# Patient Record
Sex: Female | Born: 1985 | Race: White | Hispanic: No | Marital: Married | Smoking: Never smoker
Health system: Southern US, Community
[De-identification: ages and names within clinical notes are randomized; demographics above are authoritative.]

## PROBLEM LIST (undated history)

## (undated) DIAGNOSIS — K760 Fatty (change of) liver, not elsewhere classified: Secondary | ICD-10-CM

## (undated) DIAGNOSIS — K5792 Diverticulitis of intestine, part unspecified, without perforation or abscess without bleeding: Secondary | ICD-10-CM

## (undated) DIAGNOSIS — G4733 Obstructive sleep apnea (adult) (pediatric): Secondary | ICD-10-CM

## (undated) DIAGNOSIS — Z8619 Personal history of other infectious and parasitic diseases: Secondary | ICD-10-CM

## (undated) DIAGNOSIS — U071 COVID-19: Secondary | ICD-10-CM

## (undated) DIAGNOSIS — E785 Hyperlipidemia, unspecified: Secondary | ICD-10-CM

## (undated) DIAGNOSIS — R21 Rash and other nonspecific skin eruption: Secondary | ICD-10-CM

## (undated) DIAGNOSIS — K769 Liver disease, unspecified: Secondary | ICD-10-CM

## (undated) DIAGNOSIS — F419 Anxiety disorder, unspecified: Secondary | ICD-10-CM

## (undated) HISTORY — DX: Obstructive sleep apnea (adult) (pediatric): G47.33

## (undated) HISTORY — DX: Liver disease, unspecified: K76.9

## (undated) HISTORY — DX: Personal history of other infectious and parasitic diseases: Z86.19

## (undated) HISTORY — DX: Morbid (severe) obesity due to excess calories: E66.01

## (undated) HISTORY — DX: Hyperlipidemia, unspecified: E78.5

## (undated) HISTORY — PX: WISDOM TOOTH EXTRACTION: SHX21

## (undated) HISTORY — DX: Diverticulitis of intestine, part unspecified, without perforation or abscess without bleeding: K57.92

## (undated) HISTORY — DX: Fatty (change of) liver, not elsewhere classified: K76.0

---
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
## 2007-07-15 ENCOUNTER — Other Ambulatory Visit: Admission: RE | Admit: 2007-07-15 | Discharge: 2007-07-15 | Payer: Self-pay | Admitting: Family Medicine

## 2008-07-17 ENCOUNTER — Other Ambulatory Visit: Admission: RE | Admit: 2008-07-17 | Discharge: 2008-07-17 | Payer: Self-pay | Admitting: Family Medicine

## 2009-07-13 ENCOUNTER — Encounter: Payer: Self-pay | Admitting: Pulmonary Disease

## 2010-03-03 ENCOUNTER — Inpatient Hospital Stay (HOSPITAL_COMMUNITY): Admission: RE | Admit: 2010-03-03 | Discharge: 2010-03-05 | Payer: Self-pay | Admitting: Obstetrics and Gynecology

## 2011-02-15 LAB — CBC
Hemoglobin: 12.3 g/dL (ref 12.0–15.0)
MCHC: 34 g/dL (ref 30.0–36.0)
MCHC: 34.4 g/dL (ref 30.0–36.0)
MCV: 92.8 fL (ref 78.0–100.0)
Platelets: 140 10*3/uL — ABNORMAL LOW (ref 150–400)
Platelets: 166 10*3/uL (ref 150–400)
RDW: 13.9 % (ref 11.5–15.5)
WBC: 12.2 10*3/uL — ABNORMAL HIGH (ref 4.0–10.5)

## 2011-07-24 ENCOUNTER — Other Ambulatory Visit (HOSPITAL_COMMUNITY)
Admission: RE | Admit: 2011-07-24 | Discharge: 2011-07-24 | Disposition: A | Discharge status: Home / Self Care | Payer: Self-pay | Source: Ambulatory Visit | Attending: Family Medicine | Admitting: Family Medicine

## 2011-07-24 ENCOUNTER — Other Ambulatory Visit: Payer: Self-pay | Admitting: Family Medicine

## 2011-07-24 DIAGNOSIS — Z124 Encounter for screening for malignant neoplasm of cervix: Secondary | ICD-10-CM | POA: Insufficient documentation

## 2011-07-24 LAB — VITAMIN D 25 HYDROXY (VIT D DEFICIENCY, FRACTURES): Vit D, 25-Hydroxy: 22.9

## 2013-05-21 ENCOUNTER — Ambulatory Visit (INDEPENDENT_AMBULATORY_CARE_PROVIDER_SITE_OTHER): Payer: BC Managed Care – PPO | Admitting: Family Medicine

## 2013-05-21 ENCOUNTER — Ambulatory Visit (INDEPENDENT_AMBULATORY_CARE_PROVIDER_SITE_OTHER): Payer: BC Managed Care – PPO | Admitting: Pulmonary Disease

## 2013-05-21 ENCOUNTER — Encounter: Payer: Self-pay | Admitting: Family Medicine

## 2013-05-21 ENCOUNTER — Encounter: Payer: Self-pay | Admitting: Pulmonary Disease

## 2013-05-21 VITALS — BP 116/82 | HR 86 | Temp 98.0°F | Ht 62.0 in | Wt 265.0 lb

## 2013-05-21 VITALS — BP 110/78 | HR 85 | Temp 98.1°F | Ht 62.0 in | Wt 262.2 lb

## 2013-05-21 DIAGNOSIS — R0683 Snoring: Secondary | ICD-10-CM

## 2013-05-21 DIAGNOSIS — R0989 Other specified symptoms and signs involving the circulatory and respiratory systems: Secondary | ICD-10-CM

## 2013-05-21 DIAGNOSIS — R0609 Other forms of dyspnea: Secondary | ICD-10-CM

## 2013-05-21 DIAGNOSIS — E785 Hyperlipidemia, unspecified: Secondary | ICD-10-CM

## 2013-05-21 DIAGNOSIS — G4733 Obstructive sleep apnea (adult) (pediatric): Secondary | ICD-10-CM

## 2013-05-21 HISTORY — DX: Obstructive sleep apnea (adult) (pediatric): G47.33

## 2013-05-21 NOTE — Assessment & Plan Note (Signed)
She reports snoring, sleep disruption, witnessed apnea, and daytime sleepiness.  She has family history of sleep apnea and early coronary artery disease.  Her BMI is > 35.  I am concerned she could have sleep apnea.  We discussed how sleep apnea can affect various health problems including risks for hypertension, cardiovascular disease, and diabetes.  We also discussed how sleep disruption can increase risks for accident, such as while driving.  Weight loss as a means of improving sleep apnea was also reviewed.  Additional treatment options discussed were CPAP therapy, oral appliance, and surgical intervention.  To further assess will arrange for home sleep study.

## 2013-05-21 NOTE — Assessment & Plan Note (Addendum)
With nonrestorative sleep.  Reasonable to have OSA evaluation so will refer to pulm. ESS today = 14

## 2013-05-21 NOTE — Progress Notes (Signed)
Chief Complaint  Patient presents with  . Sleep Consult    referred by Dr. Sharen Hones for sleep apnea. Epworth Scale: 15.    History of Present Illness: Gabrielle Leach is a 27 y.o. female for evaluation of sleep problems.  She has gained about 100 lbs over the past 10 years.  With this weight change she has noticed more trouble with her sleep.  Her husband has been worried about her snoring, and has told her she stops breathing while asleep.  He now sleeps in a separate room.  She gets a very dry mouth, and has trouble sleeping on her back.  She falls asleep easily while sitting quiet.  She doesn't remember having dreams anymore.  She goes to sleep at 10 pm.  She falls asleep instantly.  She wakes up frequently times to use the bathroom, and is a restless sleeper.  She gets out of bed at 6 am.  She has to hit her snooze button several times before waking up.  She feels tire in the morning.  She occasionally gets morning headache.  She does not use anything to help her fall sleep.  She drinks coffee, energy drinks, and soda to help stay awake.  She occasionally talks in her sleep, and will sometimes get cramps in her legs  She denies sleep walking, sleep talking, bruxism, or nightmares.  There is no history of restless legs.  She denies sleep hallucinations, sleep paralysis, or cataplexy.  The Epworth score is 15 out of 24.   Gabrielle Leach  has a past medical history of Morbid obesity; HLD (hyperlipidemia); and History of chicken pox.  Gabrielle Leach  has no past surgical history on file.  Prior to Admission medications   Medication Sig Start Date End Date Taking? Authorizing Provider  loratadine (CLARITIN) 10 MG tablet Take 10 mg by mouth as needed for allergies.   Yes Historical Provider, MD    No Known Allergies  Her family history includes CAD (age of onset: 80) in her maternal grandmother; CAD (age of onset: 67) in her paternal grandmother; CAD (age of onset: 40) in her paternal  grandfather; Cancer in her others; Diabetes in her father, maternal grandmother, and maternal uncle; Heart disease in her mother; Hypertension in her paternal aunt; and Parkinson's disease in her paternal grandfather.  She  reports that she has never smoked. She has never used smokeless tobacco. She reports that  drinks alcohol. She reports that she does not use illicit drugs.  Review of Systems  Constitutional: Negative for fever, chills, diaphoresis, activity change, appetite change, fatigue and unexpected weight change.  HENT: Positive for congestion, sore throat and sneezing. Negative for hearing loss, ear pain, nosebleeds, facial swelling, rhinorrhea, mouth sores, trouble swallowing, neck pain, neck stiffness, dental problem, voice change, postnasal drip, sinus pressure, tinnitus and ear discharge.   Eyes: Negative for photophobia, discharge, itching and visual disturbance.  Respiratory: Positive for shortness of breath. Negative for apnea, cough, choking, chest tightness, wheezing and stridor.   Cardiovascular: Negative for chest pain, palpitations and leg swelling.  Gastrointestinal: Negative for nausea, vomiting, abdominal pain, constipation, blood in stool and abdominal distention.  Genitourinary: Negative for dysuria, urgency, frequency, hematuria, flank pain, decreased urine volume and difficulty urinating.  Musculoskeletal: Positive for joint swelling. Negative for myalgias, back pain, arthralgias and gait problem.  Skin: Negative for color change, pallor and rash.  Neurological: Positive for headaches. Negative for dizziness, tremors, seizures, syncope, speech difficulty, weakness, light-headedness and numbness.  Hematological: Negative for adenopathy. Does not bruise/bleed easily.  Psychiatric/Behavioral: Negative for confusion, sleep disturbance and agitation. The patient is not nervous/anxious.    Physical Exam:  General - No distress ENT - No sinus tenderness, MP 2, 2 + tonsils,  no oral exudate, no LAN, no thyromegaly, TM clear, pupils equal/reactive Cardiac - s1s2 regular, no murmur, pulses symmetric Chest - No wheeze/rales/dullness, good air entry, normal respiratory excursion Back - No focal tenderness Abd - Soft, non-tender, no organomegaly, + bowel sounds Ext - No edema Neuro - Normal strength, cranial nerves intact Skin - No rashes Psych - Normal mood, and behavior

## 2013-05-21 NOTE — Assessment & Plan Note (Signed)
Encouraged increased regular exercise. Discussed healthy lifestyle changes, suggested start regular brisk walking with family at park.

## 2013-05-21 NOTE — Patient Instructions (Signed)
Will arrange for home sleep study Will call to arrange for follow up after sleep study reviewed  

## 2013-05-21 NOTE — Assessment & Plan Note (Signed)
Check FLP when returns fasting. 

## 2013-05-21 NOTE — Patient Instructions (Signed)
Good to meet you today. Call us with questions. Return to see me at your convenience for physical, prior fasting for blood work. Pass by Marion's office today to schedule referral to lung doctor for sleep apnea evaluation.

## 2013-05-21 NOTE — Progress Notes (Deleted)
  Subjective:    Patient ID: Gabrielle Leach, female    DOB: Nov 17, 1986, 27 y.o.   MRN: [id number]  HPI    Review of Systems  Constitutional: Negative for fever, chills, diaphoresis, activity change, appetite change, fatigue and unexpected weight change.  HENT: Positive for congestion, sore throat and sneezing. Negative for hearing loss, ear pain, nosebleeds, facial swelling, rhinorrhea, mouth sores, trouble swallowing, neck pain, neck stiffness, dental problem, voice change, postnasal drip, sinus pressure, tinnitus and ear discharge.   Eyes: Negative for photophobia, discharge, itching and visual disturbance.  Respiratory: Positive for shortness of breath. Negative for apnea, cough, choking, chest tightness, wheezing and stridor.   Cardiovascular: Negative for chest pain, palpitations and leg swelling.  Gastrointestinal: Negative for nausea, vomiting, abdominal pain, constipation, blood in stool and abdominal distention.  Genitourinary: Negative for dysuria, urgency, frequency, hematuria, flank pain, decreased urine volume and difficulty urinating.  Musculoskeletal: Positive for joint swelling. Negative for myalgias, back pain, arthralgias and gait problem.  Skin: Negative for color change, pallor and rash.  Neurological: Positive for headaches. Negative for dizziness, tremors, seizures, syncope, speech difficulty, weakness, light-headedness and numbness.  Hematological: Negative for adenopathy. Does not bruise/bleed easily.  Psychiatric/Behavioral: Negative for confusion, sleep disturbance and agitation. The patient is not nervous/anxious.        Objective:   Physical Exam        Assessment & Plan:

## 2013-05-21 NOTE — Progress Notes (Signed)
Subjective:    Patient ID: Gabrielle Leach, female    DOB: Apr 26, 1986, 27 y.o.   MRN: [id number]  HPI CC: new pt to establish  Snoring - husband concerned because she snores significantly.  No apneic episodes.  No PNDyspnea.  Does awaken herself from loud snoring.  nonrestorative sleeping.  Falls asleep mid conversation.  No sleeping with driving. Easily falls asleep when reading book or watching TV. Does sleep 7-8 hours.  No trouble falling asleep.   Father with sleep apnea.  H/o borderline cholesterol levels.  Obesity - constant struggle. Body mass index is 47.95 kg/(m^2).  Has gained 100lbs in last 10 years.  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 Well woman exam - around 2012.  Normal paps in past.  Medications and allergies reviewed and updated in chart.  Past histories reviewed and updated if relevant as below. There are no active problems to display for this patient.  Past Medical History  Diagnosis Date  . Morbid obesity   . HLD (hyperlipidemia)     borderline  . History of chicken pox    History reviewed. No pertinent past surgical history. History  Substance Use Topics  . Smoking status: Never Smoker   . Smokeless tobacco: Never Used  . Alcohol Use: Yes     Comment: occ   Family History  Problem Relation Age of Onset  . CAD Paternal Grandmother 8  . CAD Paternal Grandfather 6  . Parkinson's disease Paternal Grandfather   . CAD Maternal Grandmother 45    thinks heart related  . Diabetes Maternal Grandmother   . Diabetes Father     borderline  . Diabetes Maternal Uncle   . Cancer Other     breast - great grandmother  . Cancer Other     liver - great grandfather  . Hypertension Paternal Aunt   . Heart disease Mother     "hardening of arteries"   No  Known Allergies No current outpatient prescriptions on file prior to visit.   No current facility-administered medications on file prior to visit.     Review of Systems  Constitutional: Negative for fever, chills, activity change, appetite change, fatigue and unexpected weight change.  HENT: Negative for hearing loss and neck pain.   Eyes: Positive for visual disturbance (needs to schedule eye appointment).  Respiratory: Negative for cough, chest tightness, shortness of breath and wheezing.   Cardiovascular: Negative for chest pain, palpitations and leg swelling.  Gastrointestinal: Negative for nausea, vomiting, abdominal pain, diarrhea, constipation, blood in stool and abdominal distention.  Genitourinary: Negative for hematuria and difficulty urinating.  Musculoskeletal: Negative for myalgias and arthralgias.  Skin: Negative for rash.  Neurological: Positive for headaches. Negative for dizziness, seizures and syncope.  Hematological: Negative for adenopathy. Does not bruise/bleed easily.  Psychiatric/Behavioral: Negative for dysphoric mood. The patient is not nervous/anxious.        Objective:   Physical Exam  Nursing note and vitals reviewed. Constitutional: She is oriented to person, place, and time. She appears well-developed and well-nourished. No distress.  HENT:  Head: Normocephalic and atraumatic.  Right Ear: External ear normal.  Left Ear: External ear normal.  Nose: Nose normal.  Mouth/Throat: Oropharynx is clear and moist. No oropharyngeal exudate.  mallampati 1  Eyes: Conjunctivae and EOM are normal. Pupils are equal,  round, and reactive to light. No scleral icterus.  Neck: Normal range of motion. Neck supple.  Cardiovascular: Normal rate, regular rhythm, normal heart sounds and intact distal pulses.   No murmur heard. Pulses:      Radial pulses are 2+ on the right side, and 2+ on the left side.  Pulmonary/Chest: Effort normal and breath sounds normal. No  respiratory distress. She has no wheezes. She has no rales.  Musculoskeletal: Normal range of motion. She exhibits no edema.  Lymphadenopathy:    She has no cervical adenopathy.  Neurological: She is alert and oriented to person, place, and time.  CN grossly intact, station and gait intact  Skin: Skin is warm and dry. No rash noted.  Psychiatric: She has a normal mood and affect. Her behavior is normal. Judgment and thought content normal.       Assessment & Plan:

## 2013-05-25 ENCOUNTER — Other Ambulatory Visit: Payer: Self-pay | Admitting: Family Medicine

## 2013-05-25 DIAGNOSIS — E785 Hyperlipidemia, unspecified: Secondary | ICD-10-CM

## 2013-05-25 DIAGNOSIS — D649 Anemia, unspecified: Secondary | ICD-10-CM

## 2013-05-26 ENCOUNTER — Other Ambulatory Visit (INDEPENDENT_AMBULATORY_CARE_PROVIDER_SITE_OTHER): Payer: BC Managed Care – PPO

## 2013-05-26 DIAGNOSIS — E785 Hyperlipidemia, unspecified: Secondary | ICD-10-CM

## 2013-05-26 DIAGNOSIS — D649 Anemia, unspecified: Secondary | ICD-10-CM

## 2013-05-26 LAB — LIPID PANEL
Cholesterol: 222 mg/dL — ABNORMAL HIGH (ref 0–200)
Triglycerides: 140 mg/dL (ref 0.0–149.0)
VLDL: 28 mg/dL (ref 0.0–40.0)

## 2013-05-26 LAB — CBC WITH DIFFERENTIAL/PLATELET
Basophils Relative: 0.5 % (ref 0.0–3.0)
Eosinophils Relative: 2 % (ref 0.0–5.0)
Hemoglobin: 13.4 g/dL (ref 12.0–15.0)
Lymphocytes Relative: 39.5 % (ref 12.0–46.0)
Monocytes Relative: 7.6 % (ref 3.0–12.0)
Neutrophils Relative %: 50.4 % (ref 43.0–77.0)
RBC: 4.24 Mil/uL (ref 3.87–5.11)
WBC: 7.3 10*3/uL (ref 4.5–10.5)

## 2013-05-26 LAB — COMPREHENSIVE METABOLIC PANEL
BUN: 15 mg/dL (ref 6–23)
CO2: 21 mEq/L (ref 19–32)
Calcium: 8.9 mg/dL (ref 8.4–10.5)
Chloride: 105 mEq/L (ref 96–112)
Creatinine, Ser: 0.8 mg/dL (ref 0.4–1.2)
GFR: 86.68 mL/min (ref >60.00)
Total Bilirubin: 0.5 mg/dL (ref 0.3–1.2)

## 2013-05-28 DIAGNOSIS — G479 Sleep disorder, unspecified: Secondary | ICD-10-CM

## 2013-05-28 DIAGNOSIS — R0989 Other specified symptoms and signs involving the circulatory and respiratory systems: Secondary | ICD-10-CM

## 2013-05-28 DIAGNOSIS — G4710 Hypersomnia, unspecified: Secondary | ICD-10-CM

## 2013-05-28 DIAGNOSIS — R0609 Other forms of dyspnea: Secondary | ICD-10-CM

## 2013-05-29 ENCOUNTER — Telehealth: Payer: Self-pay | Admitting: Pulmonary Disease

## 2013-05-29 DIAGNOSIS — G4730 Sleep apnea, unspecified: Secondary | ICD-10-CM

## 2013-05-29 NOTE — Telephone Encounter (Signed)
HST 05/28/13 >> AHI 7.9, SaO2 low 89%.  Will have my nurse schedule ROV to review results.  Okay to over book visit.

## 2013-06-02 ENCOUNTER — Other Ambulatory Visit (HOSPITAL_COMMUNITY)
Admission: RE | Admit: 2013-06-02 | Discharge: 2013-06-02 | Disposition: A | Discharge status: Home / Self Care | Payer: BC Managed Care – PPO | Source: Ambulatory Visit | Attending: Family Medicine | Admitting: Family Medicine

## 2013-06-02 ENCOUNTER — Ambulatory Visit (INDEPENDENT_AMBULATORY_CARE_PROVIDER_SITE_OTHER): Payer: BC Managed Care – PPO | Admitting: Family Medicine

## 2013-06-02 ENCOUNTER — Encounter: Payer: Self-pay | Admitting: Family Medicine

## 2013-06-02 VITALS — BP 110/70 | HR 84 | Temp 98.5°F | Ht 62.25 in | Wt 263.0 lb

## 2013-06-02 DIAGNOSIS — Z0001 Encounter for general adult medical examination with abnormal findings: Secondary | ICD-10-CM | POA: Insufficient documentation

## 2013-06-02 DIAGNOSIS — Z0000 Encounter for general adult medical examination without abnormal findings: Secondary | ICD-10-CM

## 2013-06-02 DIAGNOSIS — E785 Hyperlipidemia, unspecified: Secondary | ICD-10-CM

## 2013-06-02 DIAGNOSIS — R21 Rash and other nonspecific skin eruption: Secondary | ICD-10-CM | POA: Insufficient documentation

## 2013-06-02 DIAGNOSIS — Z01419 Encounter for gynecological examination (general) (routine) without abnormal findings: Secondary | ICD-10-CM | POA: Insufficient documentation

## 2013-06-02 LAB — HM PAP SMEAR

## 2013-06-02 MED ORDER — DOXYCYCLINE HYCLATE 100 MG PO CAPS: 100.0000 mg | ORAL_CAPSULE | Freq: Two times a day (BID) | ORAL | Status: DC

## 2013-06-02 MED ORDER — NORETHINDRONE ACET-ETHINYL EST 1-20 MG-MCG PO TABS: 1.0000 | ORAL_TABLET | Freq: Every day | ORAL | Status: DC
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
## 2013-06-02 NOTE — Assessment & Plan Note (Addendum)
Anticipate folliculitis - treat with doxy course. Alternatively consider early hydradenitis - and referral to derm.   Doubt PCOS.  Consider checking for this if doxy course does not improve sxs.

## 2013-06-02 NOTE — Patient Instructions (Addendum)
Low chol diet handout provided today. I've sent in birth control - start on Sunday after next period. Return as needed or in 1 year for next physical Try doxycyline course for possible skin infection under arms.  Watch for easy sunburns on this antibiotic.

## 2013-06-02 NOTE — Assessment & Plan Note (Signed)
Encouraged healthy diet changes.

## 2013-06-02 NOTE — Assessment & Plan Note (Signed)
TC and LDL elevated. Given her extensive fmhx CAD, recommended diet change.  Provided with low sodium diet handout.

## 2013-06-02 NOTE — Assessment & Plan Note (Addendum)
Preventative protocols reviewed and updated unless pt declined. Discussed healthy diet and lifestyle.  Discussed different birth control options - pt desires to continue OCP. Discussed importance of daily regimen. Discussed risks of hormonal contraceptives including but not limited to irregular bleed, DVT, and cardiovascular risk.

## 2013-06-02 NOTE — Telephone Encounter (Signed)
Pt has been scheduled for 06/05/2013 @ 2:00pm.

## 2013-06-04 ENCOUNTER — Encounter: Payer: Self-pay | Admitting: Family Medicine

## 2013-06-05 ENCOUNTER — Encounter: Payer: Self-pay | Admitting: Pulmonary Disease

## 2013-06-05 ENCOUNTER — Ambulatory Visit (INDEPENDENT_AMBULATORY_CARE_PROVIDER_SITE_OTHER): Payer: BC Managed Care – PPO | Admitting: Pulmonary Disease

## 2013-06-05 VITALS — BP 122/68 | HR 73 | Temp 98.4°F | Ht 62.0 in | Wt 267.2 lb

## 2013-06-05 DIAGNOSIS — G4733 Obstructive sleep apnea (adult) (pediatric): Secondary | ICD-10-CM

## 2013-06-05 NOTE — Progress Notes (Signed)
Chief Complaint  Patient presents with  . Follow-up    Pt here to discuss slepe study results    History of Present Illness: Gabrielle Leach is a 27 y.o. female with OSA.  She is here to review her home sleep study.   TESTS: HST 05/28/13 >> AHI 7.9, SaO2 low 89%.  Gabrielle Leach  has a past medical history of Morbid obesity; HLD (hyperlipidemia); History of chicken pox; and OSA (obstructive sleep apnea) (05/21/2013).  Gabrielle Leach  has no past surgical history on file.  Prior to Admission medications   Medication Sig Start Date End Date Taking? Authorizing Provider  acetaminophen (TYLENOL) 500 MG tablet Take 500 mg by mouth as needed for pain.   Yes Historical Provider, MD  doxycycline (VIBRAMYCIN) 100 MG capsule Take 1 capsule (100 mg total) by mouth 2 (two) times daily. 06/02/13  Yes Eustaquio Boyden, MD  loratadine (CLARITIN) 10 MG tablet Take 10 mg by mouth as needed for allergies.   Yes Historical Provider, MD  norethindrone-ethinyl estradiol (MICROGESTIN,JUNEL,LOESTRIN) 1-20 MG-MCG tablet Take 1 tablet by mouth daily. 06/02/13  Yes Eustaquio Boyden, MD    No Known Allergies   Physical Exam:  General - No distress ENT - No sinus tenderness, no oral exudate, no LAN, MP 2, 2 + tonsils Cardiac - s1s2 regular, no murmur Chest - No wheeze/rales/dullness Back - No focal tenderness Abd - Soft, non-tender Ext - No edema Neuro - Normal strength Skin - No rashes Psych - normal mood, and behavior   Assessment/Plan:  Gabrielle Helling, MD [facility] Pager:  [phone]

## 2013-06-05 NOTE — Patient Instructions (Signed)
Will arrange for CPAP set up  Follow up in 2 months after CPAP set up 

## 2013-06-05 NOTE — Assessment & Plan Note (Signed)
She has mild sleep apnea.  I have reviewed the recent sleep study results with the patient.  We discussed how sleep apnea can affect various health problems including risks for hypertension, cardiovascular disease, and diabetes.  We also discussed how sleep disruption can increase risks for accident, such as while driving.  Weight loss as a means of improving sleep apnea was also reviewed.  Additional treatment options discussed were CPAP therapy, oral appliance, and surgical intervention.  Will arrange for auto CPAP set up at home.    Encouraged her to continue with her diet and exercise plans.

## 2013-06-06 ENCOUNTER — Encounter: Payer: Self-pay | Admitting: Family Medicine

## 2013-06-06 ENCOUNTER — Encounter: Payer: Self-pay | Admitting: Pulmonary Disease

## 2013-06-20 ENCOUNTER — Encounter: Payer: Self-pay | Admitting: Family Medicine

## 2013-07-17 ENCOUNTER — Encounter: Payer: Self-pay | Admitting: Internal Medicine

## 2013-07-17 ENCOUNTER — Ambulatory Visit (INDEPENDENT_AMBULATORY_CARE_PROVIDER_SITE_OTHER): Payer: BC Managed Care – PPO | Admitting: Internal Medicine

## 2013-07-17 VITALS — BP 112/82 | HR 79 | Temp 97.7°F | Wt 263.0 lb

## 2013-07-17 DIAGNOSIS — S29012A Strain of muscle and tendon of back wall of thorax, initial encounter: Secondary | ICD-10-CM

## 2013-07-17 DIAGNOSIS — S239XXA Sprain of unspecified parts of thorax, initial encounter: Secondary | ICD-10-CM

## 2013-07-17 DIAGNOSIS — R1011 Right upper quadrant pain: Secondary | ICD-10-CM

## 2013-07-17 MED ORDER — CYCLOBENZAPRINE HCL 10 MG PO TABS: 10.0000 mg | ORAL_TABLET | Freq: Three times a day (TID) | ORAL | Status: DC | PRN

## 2013-07-17 NOTE — Patient Instructions (Signed)
Back Exercises These exercises may help you when beginning to rehabilitate your injury. Your symptoms may resolve with or without further involvement from your physician, physical therapist or athletic trainer. While completing these exercises, remember:   Restoring tissue flexibility helps normal motion to return to the joints. This allows healthier, less painful movement and activity.  An effective stretch should be held for at least 30 seconds.  A stretch should never be painful. You should only feel a gentle lengthening or release in the stretched tissue. STRETCH  Extension, Prone on Elbows   Lie on your stomach on the floor, a bed will be too soft. Place your palms about shoulder width apart and at the height of your head.  Place your elbows under your shoulders. If this is too painful, stack pillows under your chest.  Allow your body to relax so that your hips drop lower and make contact more completely with the floor.  Hold this position for __________ seconds.  Slowly return to lying flat on the floor. Repeat __________ times. Complete this exercise __________ times per day.  RANGE OF MOTION  Extension, Prone Press Ups   Lie on your stomach on the floor, a bed will be too soft. Place your palms about shoulder width apart and at the height of your head.  Keeping your back as relaxed as possible, slowly straighten your elbows while keeping your hips on the floor. You may adjust the placement of your hands to maximize your comfort. As you gain motion, your hands will come more underneath your shoulders.  Hold this position __________ seconds.  Slowly return to lying flat on the floor. Repeat __________ times. Complete this exercise __________ times per day.  RANGE OF MOTION- Quadruped, Neutral Spine   Assume a hands and knees position on a firm surface. Keep your hands under your shoulders and your knees under your hips. You may place padding under your knees for comfort.  Drop  your head and point your tail bone toward the ground below you. This will round out your low back like an angry cat. Hold this position for __________ seconds.  Slowly lift your head and release your tail bone so that your back sags into a large arch, like an old horse.  Hold this position for __________ seconds.  Repeat this until you feel limber in your low back.  Now, find your "sweet spot." This will be the most comfortable position somewhere between the two previous positions. This is your neutral spine. Once you have found this position, tense your stomach muscles to support your low back.  Hold this position for __________ seconds. Repeat __________ times. Complete this exercise __________ times per day.  STRETCH  Flexion, Single Knee to Chest   Lie on a firm bed or floor with both legs extended in front of you.  Keeping one leg in contact with the floor, bring your opposite knee to your chest. Hold your leg in place by either grabbing behind your thigh or at your knee.  Pull until you feel a gentle stretch in your low back. Hold __________ seconds.  Slowly release your grasp and repeat the exercise with the opposite side. Repeat __________ times. Complete this exercise __________ times per day.  STRETCH - Hamstrings, Standing  Stand or sit and extend your right / left leg, placing your foot on a chair or foot stool  Keeping a slight arch in your low back and your hips straight forward.  Lead with your chest and   lean forward at the waist until you feel a gentle stretch in the back of your right / left knee or thigh. (When done correctly, this exercise requires leaning only a small distance.)  Hold this position for __________ seconds. Repeat __________ times. Complete this stretch __________ times per day. STRENGTHENING  Deep Abdominals, Pelvic Tilt   Lie on a firm bed or floor. Keeping your legs in front of you, bend your knees so they are both pointed toward the ceiling and  your feet are flat on the floor.  Tense your lower abdominal muscles to press your low back into the floor. This motion will rotate your pelvis so that your tail bone is scooping upwards rather than pointing at your feet or into the floor.  With a gentle tension and even breathing, hold this position for __________ seconds. Repeat __________ times. Complete this exercise __________ times per day.  STRENGTHENING  Abdominals, Crunches   Lie on a firm bed or floor. Keeping your legs in front of you, bend your knees so they are both pointed toward the ceiling and your feet are flat on the floor. Cross your arms over your chest.  Slightly tip your chin down without bending your neck.  Tense your abdominals and slowly lift your trunk high enough to just clear your shoulder blades. Lifting higher can put excessive stress on the low back and does not further strengthen your abdominal muscles.  Control your return to the starting position. Repeat __________ times. Complete this exercise __________ times per day.  STRENGTHENING  Quadruped, Opposite UE/LE Lift   Assume a hands and knees position on a firm surface. Keep your hands under your shoulders and your knees under your hips. You may place padding under your knees for comfort.  Find your neutral spine and gently tense your abdominal muscles so that you can maintain this position. Your shoulders and hips should form a rectangle that is parallel with the floor and is not twisted.  Keeping your trunk steady, lift your right hand no higher than your shoulder and then your left leg no higher than your hip. Make sure you are not holding your breath. Hold this position __________ seconds.  Continuing to keep your abdominal muscles tense and your back steady, slowly return to your starting position. Repeat with the opposite arm and leg. Repeat __________ times. Complete this exercise __________ times per day. Document Released: 12/01/2005 Document  Revised: 02/05/2012 Document Reviewed: 02/25/2009 [employer] Patient Information 2014 [employer].  

## 2013-07-17 NOTE — Progress Notes (Signed)
Subjective:    Patient ID: Gabrielle Leach, female    DOB: [birth year], 27 y.o.   MRN: [id number]  HPI  Pt presents to the clinic today with c/o back pain. This started 3 months ago. The pain is under her right shoulder blade and usually occurs at night when she lays down. Yesterday, she did notice pain in her abdomen on the right side underneath her breast. She describes the pain as tight. She did have some associated nausea but no vomiting. She did have an episode of diarrhea. She has not had an injury to the area. The pain is not related to eating. The pain is different from reflux. She has taken goody powder which did not really help the pain. The pain is not worse with movement.  Review of Systems      Past Medical History  Diagnosis Date  . Morbid obesity   . HLD (hyperlipidemia)     borderline  . History of chicken pox   . OSA (obstructive sleep apnea) 05/21/2013    mild, set up with CPAP Craige Cotta)    Current Outpatient Prescriptions  Medication Sig Dispense Refill  . acetaminophen (TYLENOL) 500 MG tablet Take 500 mg by mouth as needed for pain.      Marland Kitchen doxycycline (VIBRAMYCIN) 100 MG capsule Take 1 capsule (100 mg total) by mouth 2 (two) times daily.  20 capsule  0  . loratadine (CLARITIN) 10 MG tablet Take 10 mg by mouth as needed for allergies.      Marland Kitchen norethindrone-ethinyl estradiol (MICROGESTIN,JUNEL,LOESTRIN) 1-20 MG-MCG tablet Take 1 tablet by mouth daily.  1 Package  11   No current facility-administered medications for this visit.    No Known Allergies  Family History  Problem Relation Age of Onset  . CAD Paternal Grandmother 68  . CAD Paternal Grandfather 79  . Parkinson's disease Paternal Grandfather   . CAD Maternal Grandmother 45    thinks heart related  . Diabetes Maternal Grandmother   . Diabetes Father     borderline  . Diabetes Maternal Uncle   . Cancer Other     breast - great grandmother  . Cancer Other     liver - great grandfather  . Hypertension  Paternal Aunt   . Heart disease Mother     "hardening of arteries"    History   Social History  . Marital Status: Married    Spouse Name: N/A    Number of Children: N/A  . Years of Education: N/A   Occupational History  . Not on file.   Social History Main Topics  . Smoking status: Never Smoker   . Smokeless tobacco: Never Used  . Alcohol Use: Yes     Comment: occ  . Drug Use: No  . Sexual Activity: Not on file   Other Topics Concern  . Not on file   Social History Narrative   Lives with husband and son, 1 cat   Occupation: Dance movement psychotherapist , 2nd grade teacher   Edu: BS   Activity: no regular exercise   Diet: good water, fruits/vegetables daily, diet soft drinks     Constitutional: Denies fever, malaise, fatigue, headache or abrupt weight changes.  Respiratory: Denies difficulty breathing, shortness of breath, cough or sputum production.   Cardiovascular: Denies chest pain, chest tightness, palpitations or swelling in the hands or feet.  Gastrointestinal: Pt reports pain in right upper abdomen. Denies bloating, constipation, diarrhea or blood in the stool.  Musculoskeletal: Pt reports  back pain. Denies decrease in range of motion, difficulty with gait, or joint pain and swelling.    No other specific complaints in a complete review of systems (except as listed in HPI above).  Objective:   Physical Exam   BP 112/82  Pulse 79  Temp(Src) 97.7 F (36.5 C) (Oral)  Wt 263 lb (119.296 kg)  BMI 48.09 kg/m2  SpO2 99% Wt Readings from Last 3 Encounters:  07/17/13 263 lb (119.296 kg)  06/05/13 267 lb 3.2 oz (121.201 kg)  06/02/13 263 lb (119.296 kg)    General: Appears her stated age, obese but well developed, well nourished in NAD. Cardiovascular: Normal rate and rhythm. S1,S2 noted.  No murmur, rubs or gallops noted. No JVD or BLE edema. No carotid bruits noted. Pulmonary/Chest: Normal effort and positive vesicular breath sounds. No respiratory distress. No  wheezes, rales or ronchi noted.  Abdomen: Soft and nontender. Normal bowel sounds, no bruits noted. No distention or masses noted. Liver, spleen and kidneys non palpable. Musculoskeletal: Normal range of motion. No signs of joint swelling. No difficulty with gait. Tender at the right trapezius.   BMET    Component Value Date/Time   NA 137 05/26/2013 0757   K 4.0 05/26/2013 0757   CL 105 05/26/2013 0757   CO2 21 05/26/2013 0757   GLUCOSE 98 05/26/2013 0757   BUN 15 05/26/2013 0757   CREATININE 0.8 05/26/2013 0757   CALCIUM 8.9 05/26/2013 0757    Lipid Panel     Component Value Date/Time   CHOL 222* 05/26/2013 0757   TRIG 140.0 05/26/2013 0757   HDL 54.20 05/26/2013 0757   CHOLHDL 4 05/26/2013 0757   VLDL 28.0 05/26/2013 0757    CBC    Component Value Date/Time   WBC 7.3 05/26/2013 0757   RBC 4.24 05/26/2013 0757   HGB 13.4 05/26/2013 0757   HCT 39.5 05/26/2013 0757   PLT 258.0 05/26/2013 0757   MCV 93.2 05/26/2013 0757   MCHC 33.9 05/26/2013 0757   RDW 12.9 05/26/2013 0757   LYMPHSABS 2.9 05/26/2013 0757   MONOABS 0.6 05/26/2013 0757   EOSABS 0.1 05/26/2013 0757   BASOSABS 0.0 05/26/2013 0757    Hgb A1C No results found for this basename: HGBA1C        Assessment & Plan:   Muscle strain of right upper back, new onset:  Advil as needed eRx for flexeril at night as needed Perform stretching exercises as indicated on handout Avoid heavy lifting for the next week  RUQ abdominal pain, new onset:  Resolved now If returns will obtain ultrasound of RUQ to assess for gallstones  Monitor symptoms, RTC if worse or persist

## 2013-07-23 ENCOUNTER — Encounter: Payer: Self-pay | Admitting: Family Medicine

## 2013-07-23 DIAGNOSIS — F411 Generalized anxiety disorder: Secondary | ICD-10-CM

## 2013-07-24 DIAGNOSIS — F419 Anxiety disorder, unspecified: Secondary | ICD-10-CM | POA: Insufficient documentation

## 2013-07-24 MED ORDER — FLUOXETINE HCL 20 MG PO TABS: 20.0000 mg | ORAL_TABLET | Freq: Every day | ORAL | Status: DC

## 2013-07-25 ENCOUNTER — Encounter: Payer: Self-pay | Admitting: Family Medicine

## 2013-07-25 ENCOUNTER — Telehealth: Payer: Self-pay | Admitting: Family Medicine

## 2013-07-25 ENCOUNTER — Ambulatory Visit
Admission: RE | Admit: 2013-07-25 | Discharge: 2013-07-25 | Disposition: A | Discharge status: Home / Self Care | Payer: BC Managed Care – PPO | Source: Ambulatory Visit | Attending: Family Medicine | Admitting: Family Medicine

## 2013-07-25 DIAGNOSIS — R1011 Right upper quadrant pain: Secondary | ICD-10-CM

## 2013-07-25 DIAGNOSIS — K769 Liver disease, unspecified: Secondary | ICD-10-CM | POA: Insufficient documentation

## 2013-07-25 DIAGNOSIS — K760 Fatty (change of) liver, not elsewhere classified: Secondary | ICD-10-CM

## 2013-07-25 NOTE — Telephone Encounter (Signed)
Spoke with patient -see imaging study. Gallbladder sludge, fatty liver, focal liver lesion. Will obtain MRI to better eval liver. Order placed in chart.

## 2013-07-25 NOTE — Telephone Encounter (Signed)
Ultrasound ordered. Please schedule office visit next week after ultrasound to discuss results.

## 2013-07-29 ENCOUNTER — Ambulatory Visit (INDEPENDENT_AMBULATORY_CARE_PROVIDER_SITE_OTHER): Payer: BC Managed Care – PPO | Admitting: Family Medicine

## 2013-07-29 ENCOUNTER — Telehealth: Payer: Self-pay | Admitting: Family Medicine

## 2013-07-29 ENCOUNTER — Encounter: Payer: Self-pay | Admitting: Family Medicine

## 2013-07-29 VITALS — BP 124/72 | HR 80 | Temp 98.1°F | Ht 62.0 in | Wt 260.5 lb

## 2013-07-29 DIAGNOSIS — K760 Fatty (change of) liver, not elsewhere classified: Secondary | ICD-10-CM

## 2013-07-29 DIAGNOSIS — K7689 Other specified diseases of liver: Secondary | ICD-10-CM

## 2013-07-29 DIAGNOSIS — F411 Generalized anxiety disorder: Secondary | ICD-10-CM

## 2013-07-29 DIAGNOSIS — K769 Liver disease, unspecified: Secondary | ICD-10-CM

## 2013-07-29 DIAGNOSIS — F418 Other specified anxiety disorders: Secondary | ICD-10-CM

## 2013-07-29 DIAGNOSIS — K8020 Calculus of gallbladder without cholecystitis without obstruction: Secondary | ICD-10-CM

## 2013-07-29 MED ORDER — ALPRAZOLAM 0.25 MG PO TABS: 0.2500 mg | ORAL_TABLET | Freq: Two times a day (BID) | ORAL | Status: DC | PRN

## 2013-07-29 NOTE — Assessment & Plan Note (Signed)
Actually anxiety directly related to gallbladder colic, and now to abnormal US findings. I have asked her to stop prozac, will use xanax prn anxiety, will monitor for improvement, if persistent anxiety, consider restarting prozac. Discussed side effects and concerns with benzo use including dependence/tolerance potential.

## 2013-07-29 NOTE — Assessment & Plan Note (Signed)
Discussed ddx of this finding.  Will await abd MRI.

## 2013-07-29 NOTE — Patient Instructions (Signed)
This sounds like gallbladder pain.   Pass by Marion's office to schedule abdominal MRI as well as surgery evaluation for gallbladder. Continue healthy diet changes as up to now. Good job! Avoid fatty greasy foods to help prevent flare. May use alprazolam as needed for anxiety.  Cholelithiasis Cholelithiasis (also called gallstones) is a form of gallbladder disease where gallstones form in your gallbladder. The gallbladder is a non-essential organ that stores bile made in the liver, which helps digest fats. Gallstones begin as small crystals and slowly grow into stones. Gallstone pain occurs when the gallbladder spasms, and a gallstone is blocking the duct. Pain can also occur when a stone passes out of the duct.  Women are more likely to develop gallstones than men. Other factors that increase the risk of gallbladder disease are:  Having multiple pregnancies. Physicians sometimes advise removing diseased gallbladders before future pregnancies.  Obesity.  Diets heavy in fried foods and fat.  Increasing age (older than 65).  Prolonged use of medications containing female hormones.  Diabetes mellitus.  Rapid weight loss.  Family history of gallstones (heredity). SYMPTOMS  Feeling sick to your stomach (nauseous).  Abdominal pain.  Yellowing of the skin (jaundice).  Sudden pain. It may persist from several minutes to several hours.  Worsening pain with deep breathing or when jarred.  Fever.  Tenderness to the touch. In some cases, when gallstones do not move into the bile duct, people have no pain or symptoms. These are called "silent" gallstones. TREATMENT In severe cases, emergency surgery may be required. HOME CARE INSTRUCTIONS   Only take over-the-counter or prescription medicines for pain, discomfort, or fever as directed by your caregiver.  Follow a low-fat diet until seen again. Fat causes the gallbladder to contract, which can result in pain.  Follow up as  instructed. Attacks are almost always recurrent and surgery is usually required for permanent treatment. SEEK IMMEDIATE MEDICAL CARE IF:   Your pain increases and is not controlled by medications.  You have an oral temperature above 102 F (38.9 C), not controlled by medication.  You develop nausea and vomiting. MAKE SURE YOU:   Understand these instructions.  Will watch your condition.  Will get help right away if you are not doing well or get worse. Document Released: 11/09/2005 Document Revised: 02/05/2012 Document Reviewed: 01/12/2011 [facility] Patient Information 2014 [place], [place].

## 2013-07-29 NOTE — Progress Notes (Signed)
  Subjective:    Patient ID: Gabrielle Leach, female    DOB: 1986/03/25, 27 y.o.   MRN: [id number]  HPI CC: f/u abnormal [place]   Seen by Nicki Reaper on 8/21, with 3 mo h/o RUQ pain described as tightness that radiated to R shoulder blade and was worse at night.  Thought muscle srain but advised if not better would consider abd [place].  Did not improve with muscle relaxant, so abd [place] was obtained.   This showed:  1. Gallbladder sludge with mildly prominent gallbladder wall thickness 2. Fatty liver 3. Hypoechoic area that measured 3.8 x 3.0 x 5.0 cm - rec further evaluation with dedicated imaging. Pending scheduling abd MRI.  Endorses intermittent RUQ abd pain worse over last several months, mainly at night.  Describes pain under right breast to shoulderblade associated with nausea and diarrhea.  Has had 2 episodes in last 2 weeks.  Episodes last 4-5 hours, progressively worsen in crescendo.  Prior to this episode had quesadilla with onions and peppers, next episode had bojangles.  Muscle relaxant didn't help.  Ibuprofen 400mg  didn't help.  pepto bismol didn't help.  No fevers/chills.  Year round at work - may need to take 1/2 day off for abd MRI.  Trying to implement healthy changes into life. Has stopped soft drinks. Went to gym this morning.  At prior visit with NP, she also endorsed anxiety attacks and we decided to start prozac at 20 mg daily.  Has been on this for last 4 days.  Actually interested in PRN med.  Wt Readings from Last 3 Encounters:  07/29/13 260 lb 8 oz (118.162 kg)  07/17/13 263 lb (119.296 kg)  06/05/13 267 lb 3.2 oz (121.201 kg)    Past Medical History  Diagnosis Date  . Morbid obesity   . HLD (hyperlipidemia)     borderline  . History of chicken pox   . OSA (obstructive sleep apnea) 05/21/2013    mild, set up with CPAP ([place])  . Fatty liver   . Liver lesion    No past surgical history on file.  Review of Systems Per HPI    Objective:   Physical Exam  Nursing  note and vitals reviewed. Constitutional: She appears well-developed and well-nourished. No distress.  HENT:  Head: Normocephalic and atraumatic.  Mouth/Throat: Oropharynx is clear and moist. No oropharyngeal exudate.  Eyes: Conjunctivae and EOM are normal. Pupils are equal, round, and reactive to light.  Cardiovascular: Normal rate, regular rhythm, normal heart sounds and intact distal pulses.   No murmur heard. Pulmonary/Chest: Effort normal and breath sounds normal. No respiratory distress. She has no wheezes. She has no rales.  Abdominal: Soft. Bowel sounds are normal. She exhibits no distension and no mass. There is no hepatosplenomegaly. There is no tenderness. There is no rigidity, no rebound, no guarding, no CVA tenderness and negative Murphy's sign.  obese  Musculoskeletal: She exhibits no edema.  Skin: Skin is warm and dry. No rash noted.  Psychiatric: She has a normal mood and affect.       Assessment & Plan:

## 2013-07-29 NOTE — Assessment & Plan Note (Signed)
Discussed, rec weight loss. Healthy diet and lifestyle changes already implemented.

## 2013-07-29 NOTE — Telephone Encounter (Signed)
Confidential Office Message [address] [place], [place] [address] p. [phone] f. [phone] To: [facility] (After Hours Triage) Fax: [phone] From: [facility] Date/ Time: 07/25/2013 7:56 PM Taken By: Jethro BolusDonnamae Jude Facility: not collected Patient: Gabrielle Leach DOB: [birth year] Phone: [phone] Reason for Call: Lab results Regarding Appointment: Appt Date: Appt Time: Unknown Provider: Reason: Details: Outcome: Confidential

## 2013-07-29 NOTE — Telephone Encounter (Signed)
[facility] Triage Call Report Triage Record Num: [id number] Operator: Dustin Flock [place] Patient Name: Gabrielle Leach Call Date & Time: 07/25/2013 7:48:44PM Patient Phone: [phone] PCP: Eustaquio Boyden Patient Gender: Female PCP Fax : [phone] Patient DOB: 25-Jul-1986 Practice Name: [facility] Reason for Call: Caller: Carrera/Patient; PCP: Eustaquio Boyden [facility]); CB#: [phone]; Call regarding Lab results; Contacted pt and she states Dr Sharen Hones has called and tale dwith her about her lab results. No further needs at this time. Protocol(s) Used: Office Note Recommended Outcome per Protocol: Information Noted and Sent to Office Reason for Outcome: Caller information to office Care Advice: ~ 07/25/2013 7:56:27PM Page 1 of 1 [email or web address]

## 2013-07-29 NOTE — Assessment & Plan Note (Signed)
Describes gallbladder colic, abd [place] with gallbladder sludge. Will refer to gen surgery for evaluation.

## 2013-08-07 ENCOUNTER — Ambulatory Visit
Admission: RE | Admit: 2013-08-07 | Discharge: 2013-08-07 | Disposition: A | Discharge status: Home / Self Care | Payer: BC Managed Care – PPO | Source: Ambulatory Visit | Attending: Family Medicine | Admitting: Family Medicine

## 2013-08-07 ENCOUNTER — Other Ambulatory Visit: Payer: Self-pay | Admitting: Radiology

## 2013-08-07 DIAGNOSIS — K769 Liver disease, unspecified: Secondary | ICD-10-CM

## 2013-08-07 MED ORDER — GADOXETATE DISODIUM 0.25 MMOL/ML IV SOLN
10.0000 mL | Freq: Once | INTRAVENOUS | Status: AC | PRN
  Administered 2013-08-07: 10 mL via INTRAVENOUS

## 2013-08-11 ENCOUNTER — Ambulatory Visit (INDEPENDENT_AMBULATORY_CARE_PROVIDER_SITE_OTHER): Payer: Self-pay | Admitting: General Surgery

## 2013-09-02 ENCOUNTER — Encounter (INDEPENDENT_AMBULATORY_CARE_PROVIDER_SITE_OTHER): Payer: Self-pay | Admitting: General Surgery

## 2013-09-02 ENCOUNTER — Ambulatory Visit (INDEPENDENT_AMBULATORY_CARE_PROVIDER_SITE_OTHER): Payer: BC Managed Care – PPO | Admitting: General Surgery

## 2013-09-02 VITALS — BP 130/80 | HR 72 | Temp 97.1°F | Resp 16 | Ht 62.0 in | Wt 260.4 lb

## 2013-09-02 DIAGNOSIS — K8020 Calculus of gallbladder without cholecystitis without obstruction: Secondary | ICD-10-CM

## 2013-09-02 NOTE — Patient Instructions (Addendum)
Your attacks of pain sound very typical for gallbladder attacks.  You will be scheduled for a hepatobiliary scan. Please call Dr. Jacinto Halim  office after the scan is done to discuss the results and to make a decision about whether we do gallbladder surgery or not.  Follow an extremely low-fat diet  Go to the drug store and by either Nexium or omeprazole to turned off the acid in your stomach. Start this medicines and  take one a day.         Laparoscopic Cholecystectomy Laparoscopic cholecystectomy is surgery to remove the gallbladder. The gallbladder is located slightly to the right of center in the abdomen, behind the liver. It is a concentrating and storage sac for the bile produced in the liver. Bile aids in the digestion and absorption of fats. Gallbladder disease (cholecystitis) is an inflammation of your gallbladder. This condition is usually caused by a buildup of gallstones (cholelithiasis) in your gallbladder. Gallstones can block the flow of bile, resulting in inflammation and pain. In severe cases, emergency surgery may be required. When emergency surgery is not required, you will have time to prepare for the procedure. Laparoscopic surgery is an alternative to open surgery. Laparoscopic surgery usually has a shorter recovery time. Your common bile duct may also need to be examined and explored. Your caregiver will discuss this with you if he or she feels this should be done. If stones are found in the common bile duct, they may be removed. LET YOUR CAREGIVER KNOW ABOUT:  Allergies to food or medicine.  Medicines taken, including vitamins, herbs, eyedrops, over-the-counter medicines, and creams.  Use of steroids (by mouth or creams).  Previous problems with anesthetics or numbing medicines.  History of bleeding problems or blood clots.  Previous surgery.  Other health problems, including diabetes and kidney problems.  Possibility of pregnancy, if this applies. RISKS AND  COMPLICATIONS All surgery is associated with risks. Some problems that may occur following this procedure include:  Infection.  Damage to the common bile duct, nerves, arteries, veins, or other internal organs such as the stomach or intestines.  Bleeding.  A stone may remain in the common bile duct. BEFORE THE PROCEDURE  Do not take aspirin for 3 days prior to surgery or blood thinners for 1 week prior to surgery.  Do not eat or drink anything after midnight the night before surgery.  Let your caregiver know if you develop a cold or other infectious problem prior to surgery.  You should be present 60 minutes before the procedure or as directed. PROCEDURE  You will be given medicine that makes you sleep (general anesthetic). When you are asleep, your surgeon will make several small cuts (incisions) in your abdomen. One of these incisions is used to insert a small, lighted scope (laparoscope) into the abdomen. The laparoscope helps the surgeon see into your abdomen. Carbon dioxide gas will be pumped into your abdomen. The gas allows more room for the surgeon to perform your surgery. Other operating instruments are inserted through the other incisions. Laparoscopic procedures may not be appropriate when:  There is major scarring from previous surgery.  The gallbladder is extremely inflamed.  There are bleeding disorders or unexpected cirrhosis of the liver.  A pregnancy is near term.  Other conditions make the laparoscopic procedure impossible. If your surgeon feels it is not safe to continue with a laparoscopic procedure, he or she will perform an open abdominal procedure. In this case, the surgeon will make an incision to  open the abdomen. This gives the surgeon a larger view and field to work within. This may allow the surgeon to perform procedures that sometimes cannot be performed with a laparoscope alone. Open surgery has a longer recovery time. AFTER THE PROCEDURE  You will be  taken to the recovery area where a nurse will watch and check your progress.  You may be allowed to go home the same day.  Do not resume physical activities until directed by your caregiver.  You may resume a normal diet and activities as directed. Document Released: 11/13/2005 Document Revised: 02/05/2012 Document Reviewed: 04/28/2011 [facility] Patient Information 2014 [place], [place].

## 2013-09-02 NOTE — Progress Notes (Addendum)
Patient ID: Gabrielle Leach, female   DOB: 06/03/1986, 27 y.o.   MRN: [id number]  Chief Complaint  Patient presents with  . New Evaluation    eval GB px    HPI Gabrielle Leach is a 27 y.o. female.  She is referred by Dr. Eustaquio Boyden at [facility] for evaluation and management decisions regarding suspected biliary colic.   The patient is married. 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 This to occur at my after supper, which is usually her larger meal. She tried muscle relaxants without relief. The pain attacks have become up slightly more progressive and now occur about once a week. Has not tried PPI's.  CBC and liver tests on June 30 were normal.  Gallbladder ultrasound shows sludge which is mobile but no discrete stones, borderline wall thickening 3.5-5 mm. Liver lesion was suggested. MRI shows focal fatty sparing versus focal nodular hyperplasia in the posterior left lobe. Followup MRI in 6-12 months was suggested. This was due to be a vague finding. I discussed all these x-rays with her. She is in no distress today.  Comorbidities include morbid obesity, hyperlipidemia, obstructive sleep apnea, fatty liver, anxiety depression on Prozac and Xanax. HPI  Past Medical History  Diagnosis Date  . Morbid obesity   . HLD (hyperlipidemia)     borderline  . History of chicken pox   . OSA (obstructive sleep apnea) 05/21/2013    mild, set up with CPAP ([place])  . Fatty liver   . Liver lesion     History reviewed. No pertinent past surgical history.  Family History  Problem Relation Age of Onset  . CAD Paternal Grandmother 58  . CAD Paternal Grandfather 10  . Parkinson's disease Paternal Grandfather   . CAD Maternal Grandmother 45    thinks heart related  . Diabetes Maternal Grandmother   . Diabetes Father      borderline  . Diabetes Maternal Uncle   . Cancer Other     breast - great grandmother  . Cancer Other     liver - great grandfather  . Hypertension Paternal Aunt   . Heart disease Mother     "hardening of arteries"    Social History History  Substance Use Topics  . Smoking status: Never Smoker   . Smokeless tobacco: Never Used  . Alcohol Use: Yes     Comment: occ    No Known Allergies  Current Outpatient Prescriptions  Medication Sig Dispense Refill  . acetaminophen (TYLENOL) 500 MG tablet Take 500 mg by mouth as needed for pain.      . cyclobenzaprine (FLEXERIL) 10 MG tablet Take 1 tablet (10 mg total) by mouth 3 (three) times daily as needed for muscle spasms.  30 tablet  0  . norethindrone-ethinyl estradiol (MICROGESTIN,JUNEL,LOESTRIN) 1-20 MG-MCG tablet Take 1 tablet by mouth daily.  1 Package  11  . loratadine (CLARITIN) 10 MG tablet Take 10 mg by mouth as needed for allergies.       No current facility-administered medications for this visit.    Review of Systems Review of Systems  Constitutional: Negative for fever, chills and unexpected weight change.  HENT: Negative for hearing loss, congestion, sore throat, trouble swallowing and voice change.   Eyes: Negative for visual disturbance.  Respiratory: Negative for cough and wheezing.   Cardiovascular: Negative for  chest pain, palpitations and leg swelling.  Gastrointestinal: Positive for nausea and abdominal pain. Negative for vomiting, diarrhea, constipation, blood in stool, abdominal distention and anal bleeding.  Genitourinary: Negative for hematuria, vaginal bleeding and difficulty urinating.  Musculoskeletal: Positive for back pain. Negative for arthralgias.  Skin: Negative for rash and wound.  Neurological: Negative for seizures, syncope and headaches.  Hematological: Negative for adenopathy. Does not bruise/bleed easily.  Psychiatric/Behavioral: Negative for confusion. The patient is nervous/anxious.      Blood pressure 130/80, pulse 72, temperature 97.1 F (36.2 C), temperature source Temporal, resp. rate 16, height 5\' 2"  (1.575 m), weight 260 lb 6.4 oz (118.117 kg).  Physical Exam Physical Exam  Constitutional: She is oriented to person, place, and time. She appears well-developed and well-nourished. No distress.  BMI 47.63  HENT:  Head: Normocephalic and atraumatic.  Nose: Nose normal.  Mouth/Throat: No oropharyngeal exudate.  Eyes: Conjunctivae and EOM are normal. Pupils are equal, round, and reactive to light. Left eye exhibits no discharge. No scleral icterus.  Neck: Neck supple. No JVD present. No tracheal deviation present. No thyromegaly present.  Cardiovascular: Normal rate, regular rhythm, normal heart sounds and intact distal pulses.   No murmur heard. Pulmonary/Chest: Effort normal and breath sounds normal. No respiratory distress. She has no wheezes. She has no rales. She exhibits no tenderness.  Abdominal: Soft. Bowel sounds are normal. She exhibits no distension and no mass. There is no tenderness. There is no rebound and no guarding.  Obese. No hernias. No tenderness. Liver not enlarged.  Musculoskeletal: She exhibits no edema and no tenderness.  Lymphadenopathy:    She has no cervical adenopathy.  Neurological: She is alert and oriented to person, place, and time. She exhibits normal muscle tone. Coordination normal.  Skin: Skin is warm. No rash noted. She is not diaphoretic. No erythema. No pallor.  Psychiatric: She has a normal mood and affect. Her behavior is normal. Judgment and thought content normal.    Data Reviewed Office notes from a lower [facility], lab work from June, ultrasound, MRI  Assessment    History and gallbladder ultrasound are consistent with biliary colic. Gallstones not diagnosed. I am suspicious that she is having biliary colic.  Morbid obesity  Anxiety and depression  Sleep apnea  Fatty liver and possible FNH     Plan    I  told her that I was suspicious that she was having gallbladder attacks and that ultimately she would require a cholecystectomy.  Were going to get a CCK stimulated hepatobiliary scan first, and to avoid another office visit co-pay, we will discuss this on the phone and try to make a decision about gallbladder surgery by phone  I discussed the indications, details, techniques, and numerous risks of gallbladder surgery with her. She was given a patient information booklet. She is aware of the risk of bleeding, infection, conversion to open laparotomy, bile leak, injury to adjacent organs, hernias, and other unforeseen problems. She had has all these issues all her questions were answered. She agrees with the plan.  In the meantime she is going to start  omeprazole or Nexium daily as a PPI  trial.   Addendum(09/15/2013): Hepatobiliary scan shows nonvisualization of the gallbladder, strongly suggesting that her symptoms are due to biliary tract origin. She was contacted by our nursing staff today and offered an office visit or proceeding with gallbladder surgery. She stated that she would like to proceed with gallbladder surgery and that will be scheduled at her  convenience.       Angelia Mould. Derrell Lolling, M.D., [facility] General and Minimally invasive Surgery Breast and Colorectal Surgery Office:   [phone] Pager:   [phone]  09/02/2013, 3:23 PM

## 2013-09-15 ENCOUNTER — Telehealth (INDEPENDENT_AMBULATORY_CARE_PROVIDER_SITE_OTHER): Payer: Self-pay

## 2013-09-15 ENCOUNTER — Encounter (HOSPITAL_COMMUNITY)
Admission: RE | Admit: 2013-09-15 | Discharge: 2013-09-15 | Disposition: A | Discharge status: Home / Self Care | Payer: BC Managed Care – PPO | Source: Ambulatory Visit | Attending: General Surgery | Admitting: General Surgery

## 2013-09-15 ENCOUNTER — Other Ambulatory Visit (INDEPENDENT_AMBULATORY_CARE_PROVIDER_SITE_OTHER): Payer: Self-pay | Admitting: General Surgery

## 2013-09-15 DIAGNOSIS — R1011 Right upper quadrant pain: Secondary | ICD-10-CM | POA: Insufficient documentation

## 2013-09-15 DIAGNOSIS — K8020 Calculus of gallbladder without cholecystitis without obstruction: Secondary | ICD-10-CM

## 2013-09-15 MED ORDER — MORPHINE SULFATE 4 MG/ML IJ SOLN
INTRAMUSCULAR | Status: AC
  Administered 2013-09-15: 4 mg via INTRAVENOUS
  Filled 2013-09-15: qty 1

## 2013-09-15 MED ORDER — TECHNETIUM TC 99M MEBROFENIN IV KIT
5.0000 | PACK | Freq: Once | INTRAVENOUS | Status: AC | PRN
  Administered 2013-09-15: 5 via INTRAVENOUS

## 2013-09-15 MED ORDER — MORPHINE SULFATE 4 MG/ML IJ SOLN
4.0000 mg | Freq: Once | INTRAMUSCULAR | Status: AC
  Administered 2013-09-15: 4 mg via INTRAVENOUS

## 2013-09-15 MED ORDER — MORPHINE SULFATE 25 MG/ML IV SOLN: 4.0000 mg/h | INTRAVENOUS | Status: DC

## 2013-09-15 NOTE — Telephone Encounter (Signed)
Message copied by Ivory Broad on Mon Sep 15, 2013  3:17 PM ------      Message from: Ernestene Mention      Created: Mon Sep 15, 2013  2:35 PM       [place] Dec,      Call Ms. Thew and tell her that her HIDA scan is markedly abnormal. So the gallbladder did not visualize at all. Tell her that this essentially confirms that her symptoms are due to her gallbladder. Give her the option of going ahead and scheduling surgery. If she would like, I would be happy to call her and discuss this with her before we decide anything... whatever her preference.                  hmi      ----- Message -----         From: Rad Results In Interface         Sent: 09/15/2013   1:27 PM           To: Ernestene Mention, MD                   ------

## 2013-09-15 NOTE — Telephone Encounter (Signed)
I called and spoke to the pt.  I gave her the results.  She wants to go ahead and schedule the surgery.  She would like it to be the day after Christmas if possible.  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  I will get the orders together and send it to the schedulers.

## 2013-09-15 NOTE — Telephone Encounter (Signed)
Thanks  .Marland Kitchen Let me know if I need to enter orders into EPIC, or whether she wants this done at [facility].  hmi

## 2013-09-16 ENCOUNTER — Telehealth (INDEPENDENT_AMBULATORY_CARE_PROVIDER_SITE_OTHER): Payer: Self-pay | Admitting: General Surgery

## 2013-09-16 ENCOUNTER — Other Ambulatory Visit (INDEPENDENT_AMBULATORY_CARE_PROVIDER_SITE_OTHER): Payer: Self-pay | Admitting: General Surgery

## 2013-09-16 NOTE — Telephone Encounter (Signed)
I called and spoke to the pt.  She has no preference as to where the surgery is done.  Just what is available is fine.

## 2013-09-16 NOTE — Telephone Encounter (Signed)
Orders for lap chole entered into Epic.  hmi

## 2013-09-16 NOTE — Telephone Encounter (Signed)
Pt called sx scheduling requesting sx for 11/20 or 11/21 / no orders for surgery

## 2013-10-02 ENCOUNTER — Other Ambulatory Visit: Payer: Self-pay

## 2013-10-15 ENCOUNTER — Encounter (HOSPITAL_COMMUNITY): Payer: Self-pay | Admitting: Pharmacy Technician

## 2013-10-17 ENCOUNTER — Inpatient Hospital Stay (HOSPITAL_COMMUNITY): Admission: RE | Admit: 2013-10-17 | Payer: BC Managed Care – PPO | Source: Ambulatory Visit

## 2013-10-17 NOTE — Patient Instructions (Addendum)
Gabrielle Leach  10/17/2013                           YOUR PROCEDURE IS SCHEDULED ON: 10/21/13               PLEASE REPORT TO SHORT STAY CENTER AT :  7:00 am               CALL THIS NUMBER IF ANY PROBLEMS THE DAY OF SURGERY :               [phone]                      REMEMBER:   Do not eat food or drink liquids AFTER MIDNIGHT   Take these medicines the morning of surgery with A SIP OF WATER: none   Do not wear jewelry, make-up   Do not wear lotions, powders, or perfumes.   Do not shave legs or underarms 12 hrs. before surgery (men may shave face)  Do not bring valuables to the hospital.  Contacts, dentures or bridgework may not be worn into surgery.  Leave suitcase in the car. After surgery it may be brought to your room.  For patients admitted to the hospital more than one night, checkout time is 11:00                          The day of discharge.   Patients discharged the day of surgery will not be allowed to drive home                             If going home same day of surgery, must have someone stay with you first                           24 hrs at home and arrange for some one to drive you home from hospital.    Special Instructions:   Please read over the following fact sheets that you were given:                                  1. [name] PREPARING FOR SURGERY SHEET                                                X_____________________________________________________________________        Failure to follow these instructions may result in cancellation of your surgery

## 2013-10-17 NOTE — H&P (Signed)
Gabrielle Leach    MRN:  [id number]   Description: 27 year old female  Provider: Ernestene Mention, MD  Department: [facility]        Diagnoses    Gallbladder colic    -  Primary    [phone]      Reason for Visit    New Evaluation    eval GB px        Current Vitals - Last Recorded    BP Pulse Temp(Src) Resp Ht Wt    130/80 72 97.1 F (36.2 C) (Temporal) 16 5\' 2"  (1.575 m) 260 lb 6.4 oz (118.117 kg)    BMI 47.62 kg/m2                 History and Physical   Ernestene Mention, MD    Status: Addendum            Patient ID: Gabrielle Leach, female   DOB: 1986/04/13, 27 y.o.   MRN: [id number]               HPI Gabrielle Leach is a 27 y.o. female.  She is referred by Dr. Eustaquio Boyden at [facility] for evaluation and management decisions regarding suspected biliary colic.    The patient is married. 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 This to occur at my after supper, which is usually her larger meal. She tried muscle relaxants without relief. The pain attacks have become up slightly more progressive and now occur about once a week. Has not tried PPI's.   CBC and liver tests on June 30 were normal.   Gallbladder ultrasound shows sludge which is mobile but no discrete stones, borderline wall thickening 3.5-5 mm. Liver lesion was suggested. MRI shows focal fatty sparing versus focal nodular hyperplasia in the posterior left lobe. Followup MRI in 6-12 months was suggested. This was due to be a vague finding. I discussed all these x-rays with her. She is in no distress today.   Comorbidities include morbid obesity, hyperlipidemia, obstructive sleep apnea, fatty liver, anxiety depression on Prozac and Xanax.       Past Medical History   Diagnosis  Date   .  Morbid obesity     .  HLD  (hyperlipidemia)         borderline   .  History of chicken pox     .  OSA (obstructive sleep apnea)  05/21/2013       mild, set up with CPAP ([place])   .  Fatty liver     .  Liver lesion          History reviewed. No pertinent past surgical history.    Family History   Problem  Relation  Age of Onset   .  CAD  Paternal Grandmother  43   .  CAD  Paternal Grandfather  31   .  Parkinson's disease  Paternal Grandfather     .  CAD  Maternal Grandmother  45       thinks heart related   .  Diabetes  Maternal Grandmother     .  Diabetes  Father         borderline   .  Diabetes  Maternal Uncle     .  Cancer  Other  breast - great grandmother   .  Cancer  Other         liver - great grandfather   .  Hypertension  Paternal Aunt     .  Heart disease  Mother         "hardening of arteries"        Social History History   Substance Use Topics   .  Smoking status:  Never Smoker    .  Smokeless tobacco:  Never Used   .  Alcohol Use:  Yes         Comment: occ        No Known Allergies    Current Outpatient Prescriptions   Medication  Sig  Dispense  Refill   .  acetaminophen (TYLENOL) 500 MG tablet  Take 500 mg by mouth as needed for pain.         .  cyclobenzaprine (FLEXERIL) 10 MG tablet  Take 1 tablet (10 mg total) by mouth 3 (three) times daily as needed for muscle spasms.   30 tablet   0   .  norethindrone-ethinyl estradiol (MICROGESTIN,JUNEL,LOESTRIN) 1-20 MG-MCG tablet  Take 1 tablet by mouth daily.   1 Package   11   .  loratadine (CLARITIN) 10 MG tablet  Take 10 mg by mouth as needed for allergies.                Review of Systems   Constitutional: Negative for fever, chills and unexpected weight change.  HENT: Negative for hearing loss, congestion, sore throat, trouble swallowing and voice change.   Eyes: Negative for visual disturbance.  Respiratory: Negative for cough and wheezing.   Cardiovascular: Negative for chest pain, palpitations and leg  swelling.  Gastrointestinal: Positive for nausea and abdominal pain. Negative for vomiting, diarrhea, constipation, blood in stool, abdominal distention and anal bleeding.  Genitourinary: Negative for hematuria, vaginal bleeding and difficulty urinating.  Musculoskeletal: Positive for back pain. Negative for arthralgias.  Skin: Negative for rash and wound.  Neurological: Negative for seizures, syncope and headaches.  Hematological: Negative for adenopathy. Does not bruise/bleed easily.  Psychiatric/Behavioral: Negative for confusion. The patient is nervous/anxious.       Blood pressure 130/80, pulse 72, temperature 97.1 F (36.2 C), temperature source Temporal, resp. rate 16, height 5\' 2"  (1.575 m), weight 260 lb 6.4 oz (118.117 kg).   Physical Exam  Constitutional: She is oriented to person, place, and time. She appears well-developed and well-nourished. No distress.  BMI 47.63  HENT:   Head: Normocephalic and atraumatic.   Nose: Nose normal.   Mouth/Throat: No oropharyngeal exudate.  Eyes: Conjunctivae and EOM are normal. Pupils are equal, round, and reactive to light. Left eye exhibits no discharge. No scleral icterus.  Neck: Neck supple. No JVD present. No tracheal deviation present. No thyromegaly present.  Cardiovascular: Normal rate, regular rhythm, normal heart sounds and intact distal pulses.    No murmur heard. Pulmonary/Chest: Effort normal and breath sounds normal. No respiratory distress. She has no wheezes. She has no rales. She exhibits no tenderness.  Abdominal: Soft. Bowel sounds are normal. She exhibits no distension and no mass. There is no tenderness. There is no rebound and no guarding.  Obese. No hernias. No tenderness. Liver not enlarged.  Musculoskeletal: She exhibits no edema and no tenderness.  Lymphadenopathy:    She has no cervical adenopathy.  Neurological: She is alert and oriented to person, place, and time. She exhibits normal muscle tone.  Coordination  normal.  Skin: Skin is warm. No rash noted. She is not diaphoretic. No erythema. No pallor.  Psychiatric: She has a normal mood and affect. Her behavior is normal. Judgment and thought content normal.      Data Reviewed Office notes from a lower [facility], lab work from June, ultrasound, MRI   Assessment    History and gallbladder ultrasound are consistent with biliary colic. Gallstones not diagnosed. I am suspicious that she is having biliary colic.   Morbid obesity   Anxiety and depression   Sleep apnea   Fatty liver and possible FNH      Plan    I told her that I was suspicious that she was having gallbladder attacks and that ultimately she would require a cholecystectomy.   Were going to get a CCK stimulated hepatobiliary scan first, and to avoid another office visit co-pay, we will discuss this on the phone and try to make a decision about gallbladder surgery by phone   I discussed the indications, details, techniques, and numerous risks of gallbladder surgery with her. She was given a patient information booklet. She is aware of the risk of bleeding, infection, conversion to open laparotomy, bile leak, injury to adjacent organs, hernias, and other unforeseen problems. She had has all these issues all her questions were answered. She agrees with the plan.   In the meantime she is going to start  omeprazole or Nexium daily as a PPI  trial.    Addendum(09/15/2013): Hepatobiliary scan shows nonvisualization of the gallbladder, strongly suggesting that her symptoms are due to biliary tract origin. She was contacted by our nursing staff today and offered an office visit or proceeding with gallbladder surgery. She stated that she would like to proceed with gallbladder surgery and that will be scheduled at her convenience.          Angelia Mould. Derrell Lolling, M.D., [facility] General and Minimally invasive Surgery Breast and  Colorectal Surgery Office:   [phone] Pager:   [phone]

## 2013-10-20 ENCOUNTER — Encounter (HOSPITAL_COMMUNITY): Payer: Self-pay

## 2013-10-20 ENCOUNTER — Encounter (HOSPITAL_COMMUNITY)
Admission: RE | Admit: 2013-10-20 | Discharge: 2013-10-20 | Disposition: A | Discharge status: Home / Self Care | Payer: BC Managed Care – PPO | Source: Ambulatory Visit | Attending: General Surgery | Admitting: General Surgery

## 2013-10-20 HISTORY — DX: Anxiety disorder, unspecified: F41.9

## 2013-10-20 HISTORY — DX: Rash and other nonspecific skin eruption: R21

## 2013-10-20 LAB — COMPREHENSIVE METABOLIC PANEL
AST: 27 U/L (ref 0–37)
Albumin: 3.6 g/dL (ref 3.5–5.2)
Alkaline Phosphatase: 59 U/L (ref 39–117)
BUN: 10 mg/dL (ref 6–23)
CO2: 24 mEq/L (ref 19–32)
Calcium: 9.4 mg/dL (ref 8.4–10.5)
Creatinine, Ser: 0.76 mg/dL (ref 0.50–1.10)
GFR calc Af Amer: >90 mL/min (ref >90)
GFR calc non Af Amer: >90 mL/min (ref >90)
Glucose, Bld: 92 mg/dL (ref 70–99)
Total Protein: 7 g/dL (ref 6.0–8.3)

## 2013-10-20 LAB — CBC WITH DIFFERENTIAL/PLATELET
Basophils Absolute: 0 10*3/uL (ref 0.0–0.1)
Eosinophils Absolute: 0.1 10*3/uL (ref 0.0–0.7)
Eosinophils Relative: 1 % (ref 0–5)
HCT: 40.6 % (ref 36.0–46.0)
Hemoglobin: 13.8 g/dL (ref 12.0–15.0)
Lymphocytes Relative: 29 % (ref 12–46)
Lymphs Abs: 2.3 10*3/uL (ref 0.7–4.0)
MCH: 30.3 pg (ref 26.0–34.0)
MCHC: 34 g/dL (ref 30.0–36.0)
MCV: 89 fL (ref 78.0–100.0)
Monocytes Absolute: 0.5 10*3/uL (ref 0.1–1.0)
Platelets: 271 10*3/uL (ref 150–400)
RBC: 4.56 MIL/uL (ref 3.87–5.11)
RDW: 12.3 % (ref 11.5–15.5)
WBC: 7.9 10*3/uL (ref 4.0–10.5)

## 2013-10-20 LAB — HCG, SERUM, QUALITATIVE: Preg, Serum: NEGATIVE

## 2013-10-21 ENCOUNTER — Ambulatory Visit (HOSPITAL_COMMUNITY): Payer: BC Managed Care – PPO | Admitting: Anesthesiology

## 2013-10-21 ENCOUNTER — Encounter (HOSPITAL_COMMUNITY): Payer: Self-pay | Admitting: *Deleted

## 2013-10-21 ENCOUNTER — Encounter (HOSPITAL_COMMUNITY): Payer: BC Managed Care – PPO | Admitting: Anesthesiology

## 2013-10-21 ENCOUNTER — Ambulatory Visit (HOSPITAL_COMMUNITY): Payer: BC Managed Care – PPO

## 2013-10-21 ENCOUNTER — Ambulatory Visit (HOSPITAL_COMMUNITY)
Admission: RE | Admit: 2013-10-21 | Discharge: 2013-10-21 | Disposition: A | Discharge status: Home / Self Care | Payer: BC Managed Care – PPO | Source: Ambulatory Visit | Attending: General Surgery | Admitting: General Surgery

## 2013-10-21 ENCOUNTER — Encounter (HOSPITAL_COMMUNITY)
Admission: RE | Disposition: A | Discharge status: Home / Self Care | Payer: Self-pay | Source: Ambulatory Visit | Attending: General Surgery

## 2013-10-21 DIAGNOSIS — E785 Hyperlipidemia, unspecified: Secondary | ICD-10-CM | POA: Insufficient documentation

## 2013-10-21 DIAGNOSIS — Z79899 Other long term (current) drug therapy: Secondary | ICD-10-CM | POA: Insufficient documentation

## 2013-10-21 DIAGNOSIS — K7689 Other specified diseases of liver: Secondary | ICD-10-CM | POA: Insufficient documentation

## 2013-10-21 DIAGNOSIS — K8010 Calculus of gallbladder with chronic cholecystitis without obstruction: Secondary | ICD-10-CM | POA: Insufficient documentation

## 2013-10-21 DIAGNOSIS — Z6841 Body Mass Index (BMI) 40.0 and over, adult: Secondary | ICD-10-CM | POA: Insufficient documentation

## 2013-10-21 DIAGNOSIS — G4733 Obstructive sleep apnea (adult) (pediatric): Secondary | ICD-10-CM | POA: Insufficient documentation

## 2013-10-21 HISTORY — PX: CHOLECYSTECTOMY: SHX55

## 2013-10-21 SURGERY — LAPAROSCOPIC CHOLECYSTECTOMY WITH INTRAOPERATIVE CHOLANGIOGRAM
Anesthesia: General | Site: Abdomen | Wound class: Clean Contaminated

## 2013-10-21 MED ORDER — ACETAMINOPHEN 10 MG/ML IV SOLN
1000.0000 mg | Freq: Once | INTRAVENOUS | Status: AC
  Administered 2013-10-21: 1000 mg via INTRAVENOUS
  Filled 2013-10-21: qty 100

## 2013-10-21 MED ORDER — SUCCINYLCHOLINE CHLORIDE 20 MG/ML IJ SOLN
INTRAMUSCULAR | Status: DC | PRN
  Administered 2013-10-21: 100 mg via INTRAVENOUS

## 2013-10-21 MED ORDER — NEOSTIGMINE METHYLSULFATE 1 MG/ML IJ SOLN
INTRAMUSCULAR | Status: DC | PRN
  Administered 2013-10-21: 4 mg via INTRAVENOUS

## 2013-10-21 MED ORDER — ONDANSETRON HCL 4 MG/2ML IJ SOLN
INTRAMUSCULAR | Status: DC | PRN
  Administered 2013-10-21: 4 mg via INTRAVENOUS

## 2013-10-21 MED ORDER — PROPOFOL 10 MG/ML IV BOLUS
INTRAVENOUS | Status: AC
  Filled 2013-10-21: qty 20

## 2013-10-21 MED ORDER — SUFENTANIL CITRATE 50 MCG/ML IV SOLN
INTRAVENOUS | Status: AC
  Filled 2013-10-21: qty 1

## 2013-10-21 MED ORDER — 0.9 % SODIUM CHLORIDE (POUR BTL) OPTIME
TOPICAL | Status: DC | PRN
  Administered 2013-10-21: 1000 mL

## 2013-10-21 MED ORDER — HYDROCODONE-ACETAMINOPHEN 5-325 MG PO TABS: 1.0000 | ORAL_TABLET | Freq: Four times a day (QID) | ORAL | Status: DC | PRN

## 2013-10-21 MED ORDER — DEXAMETHASONE SODIUM PHOSPHATE 10 MG/ML IJ SOLN
INTRAMUSCULAR | Status: AC
  Filled 2013-10-21: qty 1

## 2013-10-21 MED ORDER — BUPIVACAINE-EPINEPHRINE PF 0.5-1:200000 % IJ SOLN
INTRAMUSCULAR | Status: AC
  Filled 2013-10-21: qty 30

## 2013-10-21 MED ORDER — CEFAZOLIN SODIUM-DEXTROSE 2-3 GM-% IV SOLR
INTRAVENOUS | Status: AC
  Filled 2013-10-21: qty 50

## 2013-10-21 MED ORDER — METOCLOPRAMIDE HCL 5 MG/ML IJ SOLN
INTRAMUSCULAR | Status: AC
  Filled 2013-10-21: qty 2

## 2013-10-21 MED ORDER — FENTANYL CITRATE 0.05 MG/ML IJ SOLN: 25.0000 ug | INTRAMUSCULAR | Status: DC | PRN

## 2013-10-21 MED ORDER — CHLORHEXIDINE GLUCONATE 4 % EX LIQD: 1.0000 "application " | Freq: Once | CUTANEOUS | Status: DC

## 2013-10-21 MED ORDER — CEFAZOLIN SODIUM-DEXTROSE 2-3 GM-% IV SOLR
2.0000 g | INTRAVENOUS | Status: AC
  Administered 2013-10-21: 2 g via INTRAVENOUS

## 2013-10-21 MED ORDER — LACTATED RINGERS IV SOLN
INTRAVENOUS | Status: DC | PRN
  Administered 2013-10-21: 09:00:00 via INTRAVENOUS

## 2013-10-21 MED ORDER — PROPOFOL 10 MG/ML IV BOLUS
INTRAVENOUS | Status: DC | PRN
  Administered 2013-10-21: 200 mg via INTRAVENOUS

## 2013-10-21 MED ORDER — ACETAMINOPHEN 650 MG RE SUPP
650.0000 mg | RECTAL | Status: DC | PRN
  Filled 2013-10-21: qty 1

## 2013-10-21 MED ORDER — CISATRACURIUM BESYLATE (PF) 10 MG/5ML IV SOLN
INTRAVENOUS | Status: DC | PRN
  Administered 2013-10-21: 6 mg via INTRAVENOUS
  Administered 2013-10-21: 2 mg via INTRAVENOUS

## 2013-10-21 MED ORDER — LACTATED RINGERS IV SOLN: INTRAVENOUS | Status: DC

## 2013-10-21 MED ORDER — LIDOCAINE HCL (CARDIAC) 20 MG/ML IV SOLN
INTRAVENOUS | Status: DC | PRN
  Administered 2013-10-21: 100 mg via INTRAVENOUS

## 2013-10-21 MED ORDER — BUPIVACAINE-EPINEPHRINE 0.5% -1:200000 IJ SOLN
INTRAMUSCULAR | Status: DC | PRN
  Administered 2013-10-21: 30 mL

## 2013-10-21 MED ORDER — LIDOCAINE HCL (CARDIAC) 20 MG/ML IV SOLN
INTRAVENOUS | Status: AC
  Filled 2013-10-21: qty 5

## 2013-10-21 MED ORDER — ONDANSETRON HCL 4 MG/2ML IJ SOLN: 4.0000 mg | Freq: Four times a day (QID) | INTRAMUSCULAR | Status: DC | PRN

## 2013-10-21 MED ORDER — ONDANSETRON HCL 4 MG/2ML IJ SOLN
INTRAMUSCULAR | Status: AC
  Filled 2013-10-21: qty 2

## 2013-10-21 MED ORDER — SODIUM CHLORIDE 0.9 % IJ SOLN: 3.0000 mL | Freq: Two times a day (BID) | INTRAMUSCULAR | Status: DC

## 2013-10-21 MED ORDER — HYDROMORPHONE HCL PF 1 MG/ML IJ SOLN
0.2500 mg | INTRAMUSCULAR | Status: DC | PRN
  Administered 2013-10-21: 0.5 mg via INTRAVENOUS

## 2013-10-21 MED ORDER — OXYCODONE HCL 5 MG PO TABS: 5.0000 mg | ORAL_TABLET | ORAL | Status: DC | PRN

## 2013-10-21 MED ORDER — IOHEXOL 300 MG/ML  SOLN
INTRAMUSCULAR | Status: DC | PRN
  Administered 2013-10-21: 20 mL

## 2013-10-21 MED ORDER — SUFENTANIL CITRATE 50 MCG/ML IV SOLN
INTRAVENOUS | Status: DC | PRN
  Administered 2013-10-21: 10 ug via INTRAVENOUS
  Administered 2013-10-21: 20 ug via INTRAVENOUS
  Administered 2013-10-21: 5 ug via INTRAVENOUS

## 2013-10-21 MED ORDER — SODIUM CHLORIDE 0.9 % IV SOLN: INTRAVENOUS | Status: DC

## 2013-10-21 MED ORDER — MIDAZOLAM HCL 2 MG/2ML IJ SOLN
INTRAMUSCULAR | Status: AC
  Filled 2013-10-21: qty 2

## 2013-10-21 MED ORDER — GLYCOPYRROLATE 0.2 MG/ML IJ SOLN
INTRAMUSCULAR | Status: DC | PRN
  Administered 2013-10-21: .6 mg via INTRAVENOUS

## 2013-10-21 MED ORDER — EPHEDRINE SULFATE 50 MG/ML IJ SOLN
INTRAMUSCULAR | Status: AC
  Filled 2013-10-21: qty 1

## 2013-10-21 MED ORDER — ACETAMINOPHEN 325 MG PO TABS: 650.0000 mg | ORAL_TABLET | ORAL | Status: DC | PRN

## 2013-10-21 MED ORDER — SODIUM CHLORIDE 0.9 % IJ SOLN: 3.0000 mL | INTRAMUSCULAR | Status: DC | PRN

## 2013-10-21 MED ORDER — CISATRACURIUM BESYLATE 20 MG/10ML IV SOLN
INTRAVENOUS | Status: AC
  Filled 2013-10-21: qty 10

## 2013-10-21 MED ORDER — HYDROMORPHONE HCL PF 1 MG/ML IJ SOLN
INTRAMUSCULAR | Status: AC
  Filled 2013-10-21: qty 1

## 2013-10-21 MED ORDER — LACTATED RINGERS IR SOLN
Status: DC | PRN
  Administered 2013-10-21: 1000 mL

## 2013-10-21 MED ORDER — SUCCINYLCHOLINE CHLORIDE 20 MG/ML IJ SOLN
INTRAMUSCULAR | Status: AC
  Filled 2013-10-21: qty 1

## 2013-10-21 MED ORDER — MIDAZOLAM HCL 5 MG/5ML IJ SOLN
INTRAMUSCULAR | Status: DC | PRN
  Administered 2013-10-21 (×2): 1 mg via INTRAVENOUS

## 2013-10-21 MED ORDER — GLYCOPYRROLATE 0.2 MG/ML IJ SOLN
INTRAMUSCULAR | Status: AC
  Filled 2013-10-21: qty 3

## 2013-10-21 MED ORDER — METOCLOPRAMIDE HCL 5 MG/ML IJ SOLN
10.0000 mg | Freq: Once | INTRAMUSCULAR | Status: AC
  Administered 2013-10-21: 10 mg via INTRAVENOUS

## 2013-10-21 MED ORDER — SODIUM CHLORIDE 0.9 % IJ SOLN
INTRAMUSCULAR | Status: AC
  Filled 2013-10-21: qty 10

## 2013-10-21 MED ORDER — SODIUM CHLORIDE 0.9 % IV SOLN: 250.0000 mL | INTRAVENOUS | Status: DC | PRN

## 2013-10-21 MED ORDER — DEXAMETHASONE SODIUM PHOSPHATE 10 MG/ML IJ SOLN
INTRAMUSCULAR | Status: DC | PRN
  Administered 2013-10-21: 10 mg via INTRAVENOUS

## 2013-10-21 SURGICAL SUPPLY — 31 items
APPLIER CLIP ROT 10 11.4 M/L (STAPLE) ×2
BENZOIN TINCTURE PRP APPL 2/3 (GAUZE/BANDAGES/DRESSINGS) ×2 IMPLANT
CANISTER SUCTION 2500CC (MISCELLANEOUS) ×2 IMPLANT
CLIP APPLIE ROT 10 11.4 M/L (STAPLE) ×1 IMPLANT
COVER MAYO STAND STRL (DRAPES) ×2 IMPLANT
DECANTER SPIKE VIAL GLASS SM (MISCELLANEOUS) ×2 IMPLANT
DERMABOND ADVANCED (GAUZE/BANDAGES/DRESSINGS) ×1
DERMABOND ADVANCED .7 DNX12 (GAUZE/BANDAGES/DRESSINGS) ×1 IMPLANT
DRAPE C-ARM 42X120 X-RAY (DRAPES) ×2 IMPLANT
DRAPE LAPAROSCOPIC ABDOMINAL (DRAPES) ×2 IMPLANT
DRAPE UTILITY XL STRL (DRAPES) ×2 IMPLANT
ELECT REM PT RETURN 9FT ADLT (ELECTROSURGICAL) ×2
ELECTRODE REM PT RTRN 9FT ADLT (ELECTROSURGICAL) ×1 IMPLANT
GLOVE EUDERMIC 7 POWDERFREE (GLOVE) ×2 IMPLANT
GOWN STRL REIN XL XLG (GOWN DISPOSABLE) ×8 IMPLANT
HEMOSTAT SNOW SURGICEL 2X4 (HEMOSTASIS) IMPLANT
KIT BASIN OR (CUSTOM PROCEDURE TRAY) ×2 IMPLANT
POUCH SPECIMEN RETRIEVAL 10MM (ENDOMECHANICALS) IMPLANT
SET CHOLANGIOGRAPH MIX (MISCELLANEOUS) ×2 IMPLANT
SET IRRIG TUBING LAPAROSCOPIC (IRRIGATION / IRRIGATOR) ×2 IMPLANT
SLEEVE XCEL OPT CAN 5 100 (ENDOMECHANICALS) ×2 IMPLANT
SOLUTION ANTI FOG 6CC (MISCELLANEOUS) ×2 IMPLANT
STRIP CLOSURE SKIN 1/2X4 (GAUZE/BANDAGES/DRESSINGS) ×2 IMPLANT
SUT MNCRL AB 4-0 PS2 18 (SUTURE) ×2 IMPLANT
TOWEL OR 17X26 10 PK STRL BLUE (TOWEL DISPOSABLE) ×2 IMPLANT
TOWEL OR NON WOVEN STRL DISP B (DISPOSABLE) ×2 IMPLANT
TRAY LAP CHOLE (CUSTOM PROCEDURE TRAY) ×2 IMPLANT
TROCAR BLADELESS OPT 5 100 (ENDOMECHANICALS) ×2 IMPLANT
TROCAR XCEL BLUNT TIP 100MML (ENDOMECHANICALS) ×2 IMPLANT
TROCAR XCEL NON-BLD 11X100MML (ENDOMECHANICALS) ×2 IMPLANT
TUBING INSUFFLATION 10FT LAP (TUBING) ×2 IMPLANT

## 2013-10-21 NOTE — Interval H&P Note (Signed)
History and Physical Interval Note:  10/21/2013 9:43 AM  Gabrielle Leach  has presented today for surgery, with the diagnosis of cholecystitis   The goals and the various methods of treatment have been discussed with the patient and family. After consideration of risks, benefits and other options for treatment, the patient has consented to  Procedure(s): LAPAROSCOPIC CHOLECYSTECTOMY WITH INTRAOPERATIVE CHOLANGIOGRAM (N/A) as a surgical intervention .  The patient's history has been reviewed, patient examined, no change in status, stable for surgery.  I have reviewed the patient's chart and labs.  Questions were answered to the patient's satisfaction.     Ernestene Mention

## 2013-10-21 NOTE — Preoperative (Signed)
Beta Blockers   Reason not to administer Beta Blockers:Not Applicable 

## 2013-10-21 NOTE — Transfer of Care (Signed)
Immediate Anesthesia Transfer of Care Note  Patient: Gabrielle Leach  Procedure(s) Performed: Procedure(s): LAPAROSCOPIC CHOLECYSTECTOMY WITH INTRAOPERATIVE CHOLANGIOGRAM (N/A)  Patient Location: PACU  Anesthesia Type:General  Level of Consciousness: awake, alert  and oriented  Airway & Oxygen Therapy: Patient Spontanous Breathing and Patient connected to face mask oxygen  Post-op Assessment: Report given to PACU RN and Post -op Vital signs reviewed and stable  Post vital signs: Reviewed and stable  Complications: No apparent anesthesia complications

## 2013-10-21 NOTE — Anesthesia Postprocedure Evaluation (Signed)
  Anesthesia Post-op Note  Patient: Gabrielle Leach  Procedure(s) Performed: Procedure(s) (LRB): LAPAROSCOPIC CHOLECYSTECTOMY WITH INTRAOPERATIVE CHOLANGIOGRAM (N/A)  Patient Location: PACU  Anesthesia Type: General  Level of Consciousness: awake and alert   Airway and Oxygen Therapy: Patient Spontanous Breathing  Post-op Pain: mild  Post-op Assessment: Post-op Vital signs reviewed, Patient's Cardiovascular Status Stable, Respiratory Function Stable, Patent Airway and No signs of Nausea or vomiting  Last Vitals:  Filed Vitals:   10/21/13 1200  BP: 129/57  Pulse: 62  Temp:   Resp: 15    Post-op Vital Signs: stable   Complications: No apparent anesthesia complications

## 2013-10-21 NOTE — Op Note (Signed)
Patient Name:           Gabrielle Leach   Date of Surgery:        10/21/2013  Pre op Diagnosis:      Chronic cholecystitis  Post op Diagnosis:    Same  Procedure:                 Laparoscopic cholecystectomy with cholangiogram  Surgeon:                     Angelia Mould. Derrell Lolling, M.D., FACS  Assistant:                      None  Operative Indications:   Gabrielle Leach is a 27 y.o. female. She is referred by Dr. Eustaquio Boyden at [facility] for evaluation and management decisions regarding suspected biliary colic.  The patient is married. 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 This to occur at my after supper, which is usually her larger meal. She tried muscle relaxants without relief. The pain attacks have become up slightly more progressive and now occur about once a week. Has not tried PPI's.   CBC and liver tests on June 30 were normal.  Gallbladder ultrasound shows sludge which is mobile but no discrete stones, borderline wall thickening 3.5-5 mm. Liver lesion was suggested. MRI shows focal fatty sparing versus focal nodular hyperplasia in the posterior left lobe. Followup MRI in 6-12 months was suggested. This was due to be a vague finding. I discussed all these x-rays with her. Hepatobiliary scan Showed nonvisualization of the gallbladder, strongly suggesting biliary tract origin of her symptoms. She was offered cholecystectomy and is brought to the operating room electively. Comorbidities include morbid obesity, hyperlipidemia, obstructive sleep apnea, fatty liver, anxiety depression on Prozac and Xanax.   Operative Findings:       The gallbladder was thin-walled, slightly discolored and with soft adhesions. The liver looked normal although it was large. The cholangiogram was normal, showing normal intrahepatic and  extrahepatic biliary anatomy, no filling defect, and no obstruction with good flow of contrast into the duodenum. The stomach, duodenum, small intestine, and large intestine were grossly normal to inspection.  Procedure in Detail:          Following the induction of general endotracheal anesthesia the patient's abdomen was prepped and draped in a sterile fashion. Surgical time out was performed, antibiotics were given. 0.5% Marcaine with epinephrine was used as local infiltration anesthetic. A vertically oriented incision was made in the lower rim of the umbilicus. The fascia was incised in the midline. Abdominal cavity was entered under direct  vision. An 11 mm Hassan trocar was inserted and secured with a pursestring suture of 0 Vicryl. Pneumoperitoneum was created and  the camera was inserted. 11 mm trocar was placed in subxiphoid region and two 5 mm trocars placed in the right upper quadrant. The gallbladder fundus was elevated. Adhesions were taken down. I dissected out the cystic duct and cystic artery very carefully demonstrated 2 and only 2 structures going to the gallbladder with good window behind the structures. The cystic artery was secured with multiple metal clips and divided. The cholangiogram catheter was inserted into the cystic duct and a cholangiogram was obtained using the C-arm. The cholangiogram was normal as described above. The  cholangiogram catheter was removed, the cystic duct was secured with 3 more metal clips and then divided. The gallbladder was dissected from its bed with electrocautery, placed in a specimen bag and removed. The operative field was copiously irrigated. There was no bleeding and there was no bile leak. Irrigation fluid was completely clear. The trocars were removed. All the air was evacuated. The fascia at the umbilicus was closed with 0 Vicryl sutures. The skin incisions were closed with subcuticular sutures of 4-0 Monocryl and Dermabond. The patient tolerated the  procedure well taken to recovery in stable condition. EBL 10 cc. Counts correct. Complications none.     Angelia Mould. Derrell Lolling, M.D., FACS General and Minimally Invasive Surgery Breast and Colorectal Surgery  10/21/2013 11:33 AM

## 2013-10-21 NOTE — Anesthesia Procedure Notes (Signed)
Procedure Name: Intubation Date/Time: 10/21/2013 10:16 AM Performed by: Leroy Libman L Patient Re-evaluated:Patient Re-evaluated prior to inductionOxygen Delivery Method: Circle system utilized Preoxygenation: Pre-oxygenation with 100% oxygen Intubation Type: IV induction Ventilation: Mask ventilation without difficulty and Oral airway inserted - appropriate to patient size Laryngoscope Size: Hyacinth Meeker and 2 Grade View: Grade I Tube type: Oral Tube size: 7.5 mm Number of attempts: 1 Airway Equipment and Method: Stylet Placement Confirmation: ETT inserted through vocal cords under direct vision,  breath sounds checked- equal and bilateral and positive ETCO2 Secured at: 21 cm Tube secured with: Tape Dental Injury: Teeth and Oropharynx as per pre-operative assessment

## 2013-10-21 NOTE — Anesthesia Preprocedure Evaluation (Signed)
Anesthesia Evaluation  Patient identified by MRN, date of birth, ID band Patient awake    Reviewed: Allergy & Precautions, H&P , NPO status , Patient's Chart, lab work & pertinent test results  Airway Mallampati: III TM Distance: >3 FB Neck ROM: full    Dental no notable dental hx. (+) Teeth Intact and Dental Advisory Given   Pulmonary sleep apnea ,  breath sounds clear to auscultation  Pulmonary exam normal       Cardiovascular Exercise Tolerance: Good negative cardio ROS  Rhythm:regular Rate:Normal     Neuro/Psych negative neurological ROS  negative psych ROS   GI/Hepatic negative GI ROS, Neg liver ROS, Fatty liver   Endo/Other  negative endocrine ROSMorbid obesity  Renal/GU negative Renal ROS  negative genitourinary   Musculoskeletal   Abdominal (+) + obese,   Peds  Hematology negative hematology ROS (+)   Anesthesia Other Findings   Reproductive/Obstetrics negative OB ROS                           Anesthesia Physical Anesthesia Plan  ASA: III  Anesthesia Plan: General   Post-op Pain Management:    Induction: Intravenous  Airway Management Planned: Oral ETT  Additional Equipment:   Intra-op Plan:   Post-operative Plan: Extubation in OR  Informed Consent: I have reviewed the patients History and Physical, chart, labs and discussed the procedure including the risks, benefits and alternatives for the proposed anesthesia with the patient or authorized representative who has indicated his/her understanding and acceptance.   Dental Advisory Given  Plan Discussed with: CRNA and Surgeon  Anesthesia Plan Comments:         Anesthesia Quick Evaluation

## 2013-10-22 ENCOUNTER — Encounter (HOSPITAL_COMMUNITY): Payer: Self-pay | Admitting: General Surgery

## 2013-10-27 ENCOUNTER — Telehealth (INDEPENDENT_AMBULATORY_CARE_PROVIDER_SITE_OTHER): Payer: Self-pay

## 2013-10-27 NOTE — Telephone Encounter (Signed)
I left a message for the pt to let her know I have her scheduled for 11/06/13 at 0915.  I asked her to call and confirm.

## 2013-10-27 NOTE — Telephone Encounter (Signed)
Message copied by Ivory Broad on Mon Oct 27, 2013 10:49 AM ------      Message from: Mervin Kung      Created: Wed Oct 22, 2013  3:33 PM      Contact: [phone]       [place] Dec,      Pt called she needs a follow up appt i offered her the 1st avail 01/06 she can not do that day its the 1st day back to school from the holidays please advise what day she can come in.      sonya ------

## 2013-11-05 ENCOUNTER — Encounter (INDEPENDENT_AMBULATORY_CARE_PROVIDER_SITE_OTHER): Payer: Self-pay | Admitting: General Surgery

## 2013-11-05 ENCOUNTER — Ambulatory Visit (INDEPENDENT_AMBULATORY_CARE_PROVIDER_SITE_OTHER): Payer: BC Managed Care – PPO | Admitting: General Surgery

## 2013-11-05 ENCOUNTER — Telehealth (INDEPENDENT_AMBULATORY_CARE_PROVIDER_SITE_OTHER): Payer: Self-pay

## 2013-11-05 VITALS — BP 142/84 | HR 80 | Temp 97.6°F | Resp 18 | Ht 62.0 in | Wt 260.0 lb

## 2013-11-05 DIAGNOSIS — K8010 Calculus of gallbladder with chronic cholecystitis without obstruction: Secondary | ICD-10-CM

## 2013-11-05 NOTE — Telephone Encounter (Signed)
Patient called back. Appt made for this afternoon at 5:00pm.

## 2013-11-05 NOTE — Patient Instructions (Signed)
You are recovering from your gallbladder surgery without any obvious surgical complications.  It is very encouraging that you feel this much better so soon.  I advise a low fat diet.  I advise that you return to the gym and exercise next week  Return to see Dr. Derrell Lolling as needed.

## 2013-11-05 NOTE — Progress Notes (Signed)
Patient ID: Gabrielle Leach, female   DOB: 1986/01/26, 27 y.o.   MRN: [id number]  History: This young woman underwent laparoscopic cholecystectomy with cholangiogram on 10/21/2013. Final pathology report confirms chronic cholecystitis with cholelithiasis. I gave her a copy of the pathology report. She states that her symptoms have essentially resolved. She has eaten pizza and spaghetti without any bloating or pain. Denies diarrhea. Very pleased with her progress. She is back at work  teaching second-grade.  exam: Patient looks well. No distress. Abdomen soft. Obese. Nontender. Wounds looked good. No hernias  Assessment: Chronic cholecystitis with cholelithiasis. Recovering uneventfully in the early postop period following laparoscopic cholecystectomy with cholangiogram Morbid obesity Hyperlipidemia Obstructive sleep apnea Fatty liver Anxiety and depression on Prozac and Xanax  Plan: Low fat diet Resume exercise without restriction next week Return to see me as needed.   Angelia Mould. Derrell Lolling, M.D., [facility] General and Minimally invasive Surgery Breast and Colorectal Surgery Office:   [phone] Pager:   [phone]

## 2013-11-05 NOTE — Telephone Encounter (Signed)
Message copied by Ivory Broad on Wed Nov 05, 2013 12:04 PM ------      Message from: Louie Casa      Created: Tue Nov 04, 2013  5:26 PM      Regarding: Dr. Derrell Lolling 1st post-op appt      Contact: [phone]       Patient cancelled her post-op appointment on 11/06/13 @ 9:15am because she has class but she can coming Friday 11/07/13 in the afternoon if you can schedule appointment.            Thank you. ------

## 2013-11-05 NOTE — Telephone Encounter (Signed)
Left message for pt to call.  She had to cancel her appointment for Thursday and wants to come in Friday.  I do not have anything Friday but if she can come today at 5 we can see her.

## 2013-11-06 ENCOUNTER — Encounter (INDEPENDENT_AMBULATORY_CARE_PROVIDER_SITE_OTHER): Payer: BC Managed Care – PPO | Admitting: General Surgery

## 2014-02-12 ENCOUNTER — Encounter: Payer: Self-pay | Admitting: Family Medicine

## 2014-02-13 MED ORDER — ALPRAZOLAM 0.25 MG PO TABS: 0.2500 mg | ORAL_TABLET | Freq: Two times a day (BID) | ORAL | Status: DC | PRN

## 2014-02-13 NOTE — Telephone Encounter (Signed)
plz phone in xanax for patient.

## 2014-02-13 NOTE — Telephone Encounter (Signed)
Rx called in as directed.   

## 2014-05-24 ENCOUNTER — Other Ambulatory Visit: Payer: Self-pay | Admitting: Family Medicine

## 2014-05-24 DIAGNOSIS — K760 Fatty (change of) liver, not elsewhere classified: Secondary | ICD-10-CM

## 2014-05-24 DIAGNOSIS — E559 Vitamin D deficiency, unspecified: Secondary | ICD-10-CM | POA: Insufficient documentation

## 2014-05-24 DIAGNOSIS — K769 Liver disease, unspecified: Secondary | ICD-10-CM

## 2014-05-24 DIAGNOSIS — E785 Hyperlipidemia, unspecified: Secondary | ICD-10-CM

## 2014-05-28 ENCOUNTER — Encounter: Payer: Self-pay | Admitting: *Deleted

## 2014-05-28 ENCOUNTER — Other Ambulatory Visit (INDEPENDENT_AMBULATORY_CARE_PROVIDER_SITE_OTHER): Payer: BC Managed Care – PPO

## 2014-05-28 DIAGNOSIS — E785 Hyperlipidemia, unspecified: Secondary | ICD-10-CM

## 2014-05-28 DIAGNOSIS — E559 Vitamin D deficiency, unspecified: Secondary | ICD-10-CM

## 2014-05-28 LAB — LIPID PANEL
CHOL/HDL RATIO: 4
Cholesterol: 217 mg/dL — ABNORMAL HIGH (ref 0–200)
HDL: 58 mg/dL (ref >39.00)
LDL CALC: 144 mg/dL — AB (ref 0–99)
NONHDL: 159
Triglycerides: 73 mg/dL (ref 0.0–149.0)
VLDL: 14.6 mg/dL (ref 0.0–40.0)

## 2014-05-28 LAB — COMPREHENSIVE METABOLIC PANEL
ALK PHOS: 53 U/L (ref 39–117)
ALT: 31 U/L (ref 0–35)
AST: 26 U/L (ref 0–37)
Albumin: 3.7 g/dL (ref 3.5–5.2)
BILIRUBIN TOTAL: 0.5 mg/dL (ref 0.2–1.2)
BUN: 11 mg/dL (ref 6–23)
CO2: 28 mEq/L (ref 19–32)
Calcium: 9 mg/dL (ref 8.4–10.5)
Chloride: 104 mEq/L (ref 96–112)
Creatinine, Ser: 0.8 mg/dL (ref 0.4–1.2)
GFR: 92.47 mL/min (ref >60.00)
Glucose, Bld: 96 mg/dL (ref 70–99)
Potassium: 4.2 mEq/L (ref 3.5–5.1)
SODIUM: 138 meq/L (ref 135–145)
TOTAL PROTEIN: 7.2 g/dL (ref 6.0–8.3)

## 2014-05-28 LAB — VITAMIN D 25 HYDROXY (VIT D DEFICIENCY, FRACTURES): VITD: 28.57 ng/mL

## 2014-06-04 ENCOUNTER — Encounter: Payer: Self-pay | Admitting: Family Medicine

## 2014-06-04 ENCOUNTER — Ambulatory Visit (INDEPENDENT_AMBULATORY_CARE_PROVIDER_SITE_OTHER): Payer: BC Managed Care – PPO | Admitting: Family Medicine

## 2014-06-04 VITALS — BP 126/68 | HR 80 | Temp 98.7°F | Ht 62.5 in | Wt 257.5 lb

## 2014-06-04 DIAGNOSIS — K760 Fatty (change of) liver, not elsewhere classified: Secondary | ICD-10-CM

## 2014-06-04 DIAGNOSIS — E559 Vitamin D deficiency, unspecified: Secondary | ICD-10-CM

## 2014-06-04 DIAGNOSIS — Z0000 Encounter for general adult medical examination without abnormal findings: Secondary | ICD-10-CM

## 2014-06-04 DIAGNOSIS — E785 Hyperlipidemia, unspecified: Secondary | ICD-10-CM

## 2014-06-04 DIAGNOSIS — K7689 Other specified diseases of liver: Secondary | ICD-10-CM

## 2014-06-04 MED ORDER — TOPIRAMATE 50 MG PO TABS: 50.0000 mg | ORAL_TABLET | Freq: Every day | ORAL | Status: DC

## 2014-06-04 MED ORDER — ETONOGESTREL-ETHINYL ESTRADIOL 0.12-0.015 MG/24HR VA RING: VAGINAL_RING | VAGINAL | Status: DC

## 2014-06-04 NOTE — Assessment & Plan Note (Signed)
Trig improved, LDL remains elevated. Does have fmhx CAD - encouraged goal LDL <130

## 2014-06-04 NOTE — Assessment & Plan Note (Signed)
Preventative protocols reviewed and updated unless pt declined. Discussed healthy diet and lifestyle. nuvaring sent in. Pap next due 2017

## 2014-06-04 NOTE — Assessment & Plan Note (Signed)
LFTs stable.

## 2014-06-04 NOTE — Assessment & Plan Note (Signed)
rec start 1000 IU vit d daily.

## 2014-06-04 NOTE — Progress Notes (Signed)
Pre visit review using our clinic review tool, if applicable. No additional management support is needed unless otherwise documented below in the visit note. 

## 2014-06-04 NOTE — Assessment & Plan Note (Signed)
Reviewed diet and lifestyle changes. Discussed different bariatric meds - will start topamax at 25mg  daily for 1 wk then increase to 50mg  daily. Discussed common side effects including but not limited to paresthesias, need to monitor for spotting while on nuvaring or any OCP. RTC 2 mo for f/u.

## 2014-06-04 NOTE — Progress Notes (Signed)
BP 126/68  Pulse 80  Temp(Src) 98.7 F (37.1 C) (Oral)  Ht 5' 2.5" (1.588 m)  Wt 257 lb 8 oz (116.801 kg)  BMI 46.32 kg/m2  SpO2 97%  LMP 05/08/2014   CC: CPE  Subjective:    Patient ID: Gabrielle Leach, female    DOB: 1986/05/18, 28 y.o.   MRN: [id number]  HPI: Gabrielle Leach is a 28 y.o. female presenting on 06/04/2014 for Annual Exam   Gallbladder removed early this year - feeling significantly better. Still unable to tolerate fried foods - immediate diarrhea. Wt Readings from Last 3 Encounters:  06/04/14 257 lb 8 oz (116.801 kg)  11/05/13 260 lb (117.935 kg)  10/20/13 254 lb (115.214 kg)  Body mass index is 46.32 kg/(m^2). Morbid obesity - stopped soft drinks mainly changed to water, wears fit bit and uses myfitness pal, going to gym 5d/wk while working. eating more vegetables (smoothies), more greek yogurt, almonds and pistachios. Working on portion sizes.  Lab Results  Component Value Date   TSH 2.67 05/26/2013    Contraception - trouble remembering daily OCP - would like switch back to nuvaring.  Seat belt use discussed.  Sunscreen use discussed. Did get burned several weeks ago.   Preventative: Well woman - here last year - normal pap smear 2014. Will space out Q3 years Tdap - 06/2011 LMP - 05/08/2014  Lives with husband and son, 1 cat Occupation: [employer] , 2nd grade teacher  Edu: BS  Activity: no regular exercise  Diet: good water, fruits/vegetables daily, diet soft drinks   Relevant past medical, surgical, family and social history reviewed and updated as indicated.  Allergies and medications reviewed and updated. Current Outpatient Prescriptions on File Prior to Visit  Medication Sig  . acetaminophen (TYLENOL) 500 MG tablet Take 1,000 mg by mouth every 6 (six) hours as needed for mild pain or fever.   . ALPRAZolam (XANAX) 0.25 MG tablet Take 1 tablet (0.25 mg total) by mouth 2 (two) times daily as needed for anxiety.  Marland Kitchen ibuprofen (ADVIL,MOTRIN) 200  MG tablet Take 400 mg by mouth every 6 (six) hours as needed for fever, headache or mild pain.   No current facility-administered medications on file prior to visit.    Review of Systems  Constitutional: Negative for fever, chills, activity change, appetite change, fatigue and unexpected weight change.  HENT: Negative for hearing loss.   Eyes: Negative for visual disturbance.  Respiratory: Negative for cough, chest tightness, shortness of breath and wheezing.   Cardiovascular: Negative for chest pain, palpitations and leg swelling.  Gastrointestinal: Negative for nausea, vomiting, abdominal pain, diarrhea, constipation, blood in stool and abdominal distention.  Genitourinary: Negative for hematuria and difficulty urinating.  Musculoskeletal: Negative for arthralgias, myalgias and neck pain.  Skin: Negative for rash.  Neurological: Negative for dizziness, seizures, syncope and headaches.  Hematological: Negative for adenopathy. Does not bruise/bleed easily.  Psychiatric/Behavioral: Negative for dysphoric mood. The patient is not nervous/anxious.    Per HPI unless specifically indicated above    Objective:    BP 126/68  Pulse 80  Temp(Src) 98.7 F (37.1 C) (Oral)  Ht 5' 2.5" (1.588 m)  Wt 257 lb 8 oz (116.801 kg)  BMI 46.32 kg/m2  SpO2 97%  LMP 05/08/2014  Physical Exam  Nursing note and vitals reviewed. Constitutional: She is oriented to person, place, and time. She appears well-developed and well-nourished. No distress.  obese  HENT:  Head: Normocephalic and atraumatic.  Right Ear: Hearing,  tympanic membrane, external ear and ear canal normal.  Left Ear: Hearing, tympanic membrane, external ear and ear canal normal.  Nose: Nose normal.  Mouth/Throat: Uvula is midline, oropharynx is clear and moist and mucous membranes are normal. No oropharyngeal exudate, posterior oropharyngeal edema or posterior oropharyngeal erythema.  Eyes: Conjunctivae and EOM are normal. Pupils are  equal, round, and reactive to light. No scleral icterus.  Neck: Normal range of motion. Neck supple. No thyromegaly present.  Cardiovascular: Normal rate, regular rhythm, normal heart sounds and intact distal pulses.   No murmur heard. Pulses:      Radial pulses are 2+ on the right side, and 2+ on the left side.  Pulmonary/Chest: Effort normal and breath sounds normal. No respiratory distress. She has no wheezes. She has no rales.  Abdominal: Soft. Bowel sounds are normal. She exhibits no distension and no mass. There is no tenderness. There is no rebound and no guarding.  Musculoskeletal: Normal range of motion. She exhibits no edema.  Lymphadenopathy:    She has no cervical adenopathy.  Neurological: She is alert and oriented to person, place, and time.  CN grossly intact, station and gait intact  Skin: Skin is warm and dry. No rash noted.  Psychiatric: She has a normal mood and affect. Her behavior is normal. Judgment and thought content normal.   Results for orders placed in visit on 05/28/14  LIPID PANEL      Result Value Ref Range   Cholesterol 217 (*) 0 - 200 mg/dL   Triglycerides 40.973.0  0.0 - 149.0 mg/dL   HDL 81.1958.00  >14.78>39.00 mg/dL   VLDL 29.514.6  0.0 - 62.140.0 mg/dL   LDL Cholesterol 308144 (*) 0 - 99 mg/dL   Total CHOL/HDL Ratio 4     NonHDL 159.00    COMPREHENSIVE METABOLIC PANEL      Result Value Ref Range   Sodium 138  135 - 145 mEq/L   Potassium 4.2  3.5 - 5.1 mEq/L   Chloride 104  96 - 112 mEq/L   CO2 28  19 - 32 mEq/L   Glucose, Bld 96  70 - 99 mg/dL   BUN 11  6 - 23 mg/dL   Creatinine, Ser 0.8  0.4 - 1.2 mg/dL   Total Bilirubin 0.5  0.2 - 1.2 mg/dL   Alkaline Phosphatase 53  39 - 117 U/L   AST 26  0 - 37 U/L   ALT 31  0 - 35 U/L   Total Protein 7.2  6.0 - 8.3 g/dL   Albumin 3.7  3.5 - 5.2 g/dL   Calcium 9.0  8.4 - 65.710.5 mg/dL   GFR 84.6992.47  >62.95>60.00 mL/min  VITAMIN D 25 HYDROXY      Result Value Ref Range   VITD 28.57        Assessment & Plan:   Problem List Items  Addressed This Visit   Unspecified vitamin D deficiency     rec start 1000 IU vit d daily.    Morbid obesity     Reviewed diet and lifestyle changes. Discussed different bariatric meds - will start topamax at 25mg  daily for 1 wk then increase to 50mg  daily. Discussed common side effects including but not limited to paresthesias, need to monitor for spotting while on nuvaring or any OCP. RTC 2 mo for f/u.    HLD (hyperlipidemia)     Trig improved, LDL remains elevated. Does have fmhx CAD - encouraged goal LDL <130    Healthcare  maintenance - Primary     Preventative protocols reviewed and updated unless pt declined. Discussed healthy diet and lifestyle. nuvaring sent in. Pap next due 2017    Fatty liver     LFTs stable.        Follow up plan: Return in about 2 months (around 08/05/2014), or as needed, for follow up visit.

## 2014-06-04 NOTE — Patient Instructions (Signed)
Start topamax 1/2 tablet daily for 1 week then increase to 1 whole tablet daily - take at night time. Remember no pregnancy on this medication. Good to see you today, call us with questions. Return in 2 months for follow up.

## 2014-06-24 ENCOUNTER — Encounter: Payer: Self-pay | Admitting: Family Medicine

## 2014-08-06 ENCOUNTER — Ambulatory Visit (INDEPENDENT_AMBULATORY_CARE_PROVIDER_SITE_OTHER): Payer: BC Managed Care – PPO | Admitting: Family Medicine

## 2014-08-06 ENCOUNTER — Encounter: Payer: Self-pay | Admitting: Family Medicine

## 2014-08-06 DIAGNOSIS — L02429 Furuncle of limb, unspecified: Secondary | ICD-10-CM | POA: Insufficient documentation

## 2014-08-06 MED ORDER — LORCASERIN HCL 10 MG PO TABS: 1.0000 | ORAL_TABLET | Freq: Two times a day (BID) | ORAL | Status: DC

## 2014-08-06 NOTE — Patient Instructions (Signed)
Warm compresses to spot on inside of thigh - may be developing small boil. Let's try belviq daily. Coupon card provided today. Good to see you today, call us with questions.

## 2014-08-06 NOTE — Assessment & Plan Note (Signed)
Warm compresses, update if enlarging or coming to a head.

## 2014-08-06 NOTE — Progress Notes (Signed)
Pre visit review using our clinic review tool, if applicable. No additional management support is needed unless otherwise documented below in the visit note. 

## 2014-08-06 NOTE — Assessment & Plan Note (Signed)
Did not tolerate topamax - made her forget words even at lowest dose. Will do trial of belviq  daily for 1 wk then increase to bid dosing. Discussed mechanism of action and expected side effects including slight cardiovascular risk. Encouraged return to regular exercise routine. rtc 2 mo f/u visit. Pt agrees with plan.

## 2014-08-06 NOTE — Progress Notes (Signed)
BP 122/76  Pulse 72  Temp(Src) 98.2 F (36.8 C) (Oral)  Wt 256 lb 12 oz (116.461 kg)  LMP 07/19/2014   CC: 2 mo f/u  Subjective:    Patient ID: Gabrielle Leach, female    DOB: 08/03/1986, 28 y.o.   MRN: [id number]  HPI: Gabrielle Leach is a 28 y.o. female presenting on 08/06/2014 for Follow-up   See prior note for details. Last visit started on topamax for appetite suppressant effect. Did not take this med for long - caused trouble sleeping.   Morbid obesity - stopped soft drinks mainly changed to water, wears fit bit and uses myfitness pal, going to gym 5d/wk while working. Eating more vegetables (smoothies), more greek yogurt, almonds and pistachios. Working on portion sizes.  Would like skin lesion inner thighs checked out.  Wt Readings from Last 3 Encounters:  08/06/14 256 lb 12 oz (116.461 kg)  06/04/14 257 lb 8 oz (116.801 kg)  11/05/13 260 lb (117.935 kg)  Body mass index is 46.18 kg/(m^2).  Relevant past medical, surgical, family and social history reviewed and updated as indicated.  Allergies and medications reviewed and updated. Current Outpatient Prescriptions on File Prior to Visit  Medication Sig  . acetaminophen (TYLENOL) 500 MG tablet Take 1,000 mg by mouth every 6 (six) hours as needed for mild pain or fever.   . ALPRAZolam (XANAX) 0.25 MG tablet Take 1 tablet (0.25 mg total) by mouth 2 (two) times daily as needed for anxiety.  . cholecalciferol (VITAMIN D) 1000 UNITS tablet Take 1,000 Units by mouth daily.  Marland Kitchen etonogestrel-ethinyl estradiol (NUVARING) 0.12-0.015 MG/24HR vaginal ring Insert vaginally and leave in place for 3 consecutive weeks, then remove for 1 week.  Marland Kitchen ibuprofen (ADVIL,MOTRIN) 200 MG tablet Take 400 mg by mouth every 6 (six) hours as needed for fever, headache or mild pain.   No current facility-administered medications on file prior to visit.    Review of Systems Per HPI unless specifically indicated above    Objective:    BP 122/76   Pulse 72  Temp(Src) 98.2 F (36.8 C) (Oral)  Wt 256 lb 12 oz (116.461 kg)  LMP 07/19/2014  Physical Exam  Nursing note and vitals reviewed. Constitutional: She appears well-developed and well-nourished. No distress.  Morbidly obese  HENT:  Mouth/Throat: Oropharynx is clear and moist. No oropharyngeal exudate.  Neck: Normal range of motion. Neck supple.  Cardiovascular: Normal rate, regular rhythm, normal heart sounds and intact distal pulses.   No murmur heard. Pulmonary/Chest: Effort normal and breath sounds normal. No respiratory distress. She has no wheezes. She has no rales.  Musculoskeletal: She exhibits no edema.  R inner thigh small nodule L inner thigh small nodule, slight erythema but no tenderness   Results for orders placed in visit on 05/28/14  LIPID PANEL      Result Value Ref Range   Cholesterol 217 (*) 0 - 200 mg/dL   Triglycerides 56.3  0.0 - 149.0 mg/dL   HDL 87.56  >43.32 mg/dL   VLDL 95.1  0.0 - 88.4 mg/dL   LDL Cholesterol 166 (*) 0 - 99 mg/dL   Total CHOL/HDL Ratio 4     NonHDL 159.00    COMPREHENSIVE METABOLIC PANEL      Result Value Ref Range   Sodium 138  135 - 145 mEq/L   Potassium 4.2  3.5 - 5.1 mEq/L   Chloride 104  96 - 112 mEq/L   CO2 28  19 - 32 mEq/L  Glucose, Bld 96  70 - 99 mg/dL   BUN 11  6 - 23 mg/dL   Creatinine, Ser 0.8  0.4 - 1.2 mg/dL   Total Bilirubin 0.5  0.2 - 1.2 mg/dL   Alkaline Phosphatase 53  39 - 117 U/L   AST 26  0 - 37 U/L   ALT 31  0 - 35 U/L   Total Protein 7.2  6.0 - 8.3 g/dL   Albumin 3.7  3.5 - 5.2 g/dL   Calcium 9.0  8.4 - 16.1 mg/dL   GFR 09.60  >45.40 mL/min  VITAMIN D 25 HYDROXY      Result Value Ref Range   VITD 28.57        Assessment & Plan:   Problem List Items Addressed This Visit   Morbid obesity - Primary     Did not tolerate topamax - made her forget words even at lowest dose. Will do trial of belviq  daily for 1 wk then increase to bid dosing. Discussed mechanism of action and expected  side effects including slight cardiovascular risk. Encouraged return to regular exercise routine. rtc 2 mo f/u visit. Pt agrees with plan.    Relevant Medications      Lorcaserin HCl 10 MG TABS   Boil, thigh     Warm compresses, update if enlarging or coming to a head.        Follow up plan: Return in about 2 months (around 10/06/2014), or if symptoms worsen or fail to improve, for follow up visit.

## 2014-09-30 ENCOUNTER — Ambulatory Visit (INDEPENDENT_AMBULATORY_CARE_PROVIDER_SITE_OTHER): Payer: BC Managed Care – PPO | Admitting: Family Medicine

## 2014-09-30 ENCOUNTER — Encounter: Payer: Self-pay | Admitting: Family Medicine

## 2014-09-30 VITALS — BP 128/76 | HR 66 | Temp 98.7°F | Wt 254.5 lb

## 2014-09-30 DIAGNOSIS — R509 Fever, unspecified: Secondary | ICD-10-CM | POA: Insufficient documentation

## 2014-09-30 LAB — POCT RAPID STREP A (OFFICE): Rapid Strep A Screen: NEGATIVE

## 2014-09-30 NOTE — Assessment & Plan Note (Signed)
Likely viral. Did not swab for influenza today given that symptoms not classic for flu and she is outside of 48 hour window for antiviral treatment. Rapid strep neg. Advised supportive care. Call or return to clinic prn if these symptoms worsen or fail to improve as anticipated. The patient indicates understanding of these issues and agrees with the plan.

## 2014-09-30 NOTE — Progress Notes (Signed)
Patient ID: Gabrielle Leach, female   DOB: March 14, 1986, 28 y.o.   MRN: [id number]   Subjective:   Patient ID: Gabrielle Leach, female    DOB: March 14, 1986, 28 y.o.   MRN: [id number]  Gabrielle Leach is a pleasant 28 y.o. year old female, new to me,  who presents to clinic today with Cough; Nasal Congestion; and Fever  on 09/30/2014  HPI:  Fever- Tmax 102.  Symptoms started with scratchy throat 4 days ago. Had some nasal drainage and cough, along with body aches and fatigue.  Took Motrin 2 hours ago.  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 Some kids in her class have been sick lately but mainly with GI symptoms.  Current Outpatient Prescriptions on File Prior to Visit  Medication Sig Dispense Refill  . acetaminophen (TYLENOL) 500 MG tablet Take 1,000 mg by mouth every 6 (six) hours as needed for mild pain or fever.     . ALPRAZolam (XANAX) 0.25 MG tablet Take 1 tablet (0.25 mg total) by mouth 2 (two) times daily as needed for anxiety. 30 tablet 0  . cholecalciferol (VITAMIN D) 1000 UNITS tablet Take 1,000 Units by mouth daily.    Marland Kitchen. etonogestrel-ethinyl estradiol (NUVARING) 0.12-0.015 MG/24HR vaginal ring Insert vaginally and leave in place for 3 consecutive weeks, then remove for 1 week. 1 each 12  . ibuprofen (ADVIL,MOTRIN) 200 MG tablet Take 400 mg by mouth every 6 (six) hours as needed for fever, headache or mild pain.    . Lorcaserin HCl 10 MG TABS Take 1 tablet by mouth 2 (two) times daily. 60 tablet 1   No current facility-administered medications on file prior to visit.    No Known Allergies  Past Medical History  Diagnosis Date  . Morbid obesity   . HLD (hyperlipidemia)     borderline  . History of chicken pox   . Fatty liver   . Liver lesion   . OSA (obstructive sleep apnea) 05/21/2013    mild, set up with CPAP (Sood) does not use c -pap due to finance issues  . Rash     recurrent in axilla  . Anxiety     situatiional due to job    Past Surgical History  Procedure  Laterality Date  . Wisdom tooth extraction    . Cholecystectomy N/A 10/21/2013    chronic cholecystitis; Procedure: LAPAROSCOPIC CHOLECYSTECTOMY WITH INTRAOPERATIVE CHOLANGIOGRAM;  Surgeon: Ernestene MentionHaywood M Ingram, MD    Family History  Problem Relation Age of Onset  . CAD Paternal Grandmother 8752  . CAD Paternal Grandfather 4655  . Parkinson's disease Paternal Grandfather   . CAD Maternal Grandmother 45    thinks heart related  . Diabetes Maternal Grandmother   . Diabetes Father     borderline  . Diabetes Maternal Uncle   . Cancer Other     breast - great grandmother  . Cancer Other     liver - great grandfather  . Hypertension Paternal Aunt   . Heart disease Mother     "hardening of arteries"  . Cancer Cousin 40    breast    History   Social History  . Marital Status: Married    Spouse Name: N/A    Number of Children: N/A  . Years of Education: N/A   Occupational History  . Not on file.   Social History Main Topics  . Smoking status: Never Smoker   . Smokeless tobacco: Never Used  . Alcohol Use: Yes  Comment: occ  . Drug Use: No  . Sexual Activity: Not on file   Other Topics Concern  . Not on file   Social History Narrative   Lives with husband and son, 1 cat   Occupation: [employer] , 2nd grade teacher   Edu: BS   Activity: no regular exercise   Diet: good water, fruits/vegetables daily, diet soft drinks   The PMH, PSH, Social History, Family History, Medications, and allergies have been reviewed in [facility], and have been updated if relevant.  Review of Systems    See HPI No CP or SOB No wheezing No rashes No abdominal pain No LE edema Objective:    BP 128/76 mmHg  Pulse 66  Temp(Src) 98.7 F (37.1 C) (Oral)  Wt 254 lb 8 oz (115.44 kg)  SpO2 98%  LMP 09/23/2014   Physical Exam  Constitutional: She appears well-developed and well-nourished. No distress.  HENT:  Head: Normocephalic.  Right Ear: Hearing and tympanic membrane normal.  Left  Ear: Hearing and tympanic membrane normal.  Nose: Rhinorrhea present. No sinus tenderness. Right sinus exhibits no maxillary sinus tenderness and no frontal sinus tenderness. Left sinus exhibits no maxillary sinus tenderness and no frontal sinus tenderness.  Mouth/Throat: Posterior oropharyngeal erythema present. No oropharyngeal exudate, posterior oropharyngeal edema or tonsillar abscesses.  Pulmonary/Chest: Effort normal and breath sounds normal.  Skin: Skin is warm, dry and intact.  Psychiatric: She has a normal mood and affect. Her speech is normal and behavior is normal. Judgment and thought content normal. Cognition and memory are normal.  Nursing note and vitals reviewed.         Assessment & Plan:   Fever, unspecified fever cause No Follow-up on file.

## 2014-09-30 NOTE — Progress Notes (Signed)
Pre visit review using our clinic review tool, if applicable. No additional management support is needed unless otherwise documented below in the visit note. 

## 2014-10-14 ENCOUNTER — Ambulatory Visit: Payer: BC Managed Care – PPO | Admitting: Internal Medicine

## 2014-10-14 ENCOUNTER — Ambulatory Visit (INDEPENDENT_AMBULATORY_CARE_PROVIDER_SITE_OTHER): Payer: BC Managed Care – PPO | Admitting: Family Medicine

## 2014-10-14 ENCOUNTER — Encounter: Payer: Self-pay | Admitting: Family Medicine

## 2014-10-14 VITALS — BP 108/80 | HR 70 | Temp 98.1°F | Ht 62.5 in | Wt 258.5 lb

## 2014-10-14 DIAGNOSIS — R350 Frequency of micturition: Secondary | ICD-10-CM

## 2014-10-14 DIAGNOSIS — M545 Low back pain, unspecified: Secondary | ICD-10-CM

## 2014-10-14 DIAGNOSIS — R1032 Left lower quadrant pain: Secondary | ICD-10-CM

## 2014-10-14 LAB — POCT URINALYSIS DIPSTICK
Bilirubin, UA: NEGATIVE
Blood, UA: NEGATIVE
GLUCOSE UA: NEGATIVE
KETONES UA: NEGATIVE
Leukocytes, UA: NEGATIVE
Nitrite, UA: NEGATIVE
Protein, UA: NEGATIVE
Spec Grav, UA: >=1.03
UROBILINOGEN UA: 0.2
pH, UA: 6

## 2014-10-14 NOTE — Progress Notes (Signed)
Dr. Karleen HampshireSpencer T. Khara Renaud, MD, CAQ Sports Medicine Primary [facility] and Sports Medicine [address] [place], [address] Phone: [phone] Fax: [phone]  10/14/2014  Patient: Gabrielle Leach, MRN: [id number], DOB: 07/24/1986, 28 y.o.  Primary Physician:  Eustaquio BoydenJavier Gutierrez, MD  Chief Complaint: Back Pain and Urinary Frequency  Subjective:   Gabrielle Leach is a 28 y.o. very pleasant female patient who presents with the following:  Very nice young lady who yesterday Felt some significant LEFT lower quadrant pain, and it came on rather abruptly, and then it subsided.  She has not had any further LEFT lower quadrant or any abdominal pain at all today.  She also started to develop some lower back pain, but this is more in the region of L5/S1.  She does not have any significant history of low back pain, but she is morbidly obese.  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  No known inciting event.  She denies dysuria, no urgency.  No increased frequency on urination.  No blood in her urine.  Was having some pain in her lower left side. And now is having some pain and sore in her low back.  No dysuria. No urgency.   Married, monogamous. No std risk.   Past Medical History, Surgical History, Social History, Family History, Problem List, Medications, and Allergies have been reviewed and updated if relevant.  ROS: GEN: Acute illness details above GI: Tolerating PO intake GU: maintaining adequate hydration and urination Pulm: No SOB Interactive and getting along well at home.  Otherwise, ROS is as per the HPI.   Objective:   BP 108/80 mmHg  Pulse 70  Temp(Src) 98.1 F (36.7 C) (Oral)  Ht 5' 2.5" (1.588 m)  Wt 258 lb 8 oz (117.255 kg)  BMI 46.50 kg/m2  LMP 09/23/2014  GEN: WDWN, NAD, Non-toxic, A & O x 3 HEENT: Atraumatic, Normocephalic. Neck supple. No masses, No LAD. Ears and Nose: No external deformity. CV: RRR, No M/G/R. No JVD.  No thrill. No extra heart sounds. PULM: CTA B, no wheezes, crackles, rhonchi. No retractions. No resp. distress. No accessory muscle use. ABD: S, NT, ND, +BS. No rebound. No HSM. EXTR: No c/c/e NEURO Normal gait.  PSYCH: Normally interactive. Conversant. Not depressed or anxious appearing.  Calm demeanor.   There is no CVA tenderness. In the region of L4-S1, there is tenderness to palpation in the paraspinous musculature bilaterally.  There is no tenderness of the spinous processes.  Patient has mild tenderness at the SI joints bilaterally.  There is minimal to no tenderness above this level.  Straight leg raise is negative.  Neurovascularly intact throughout the lower extremities.    Laboratory and Imaging Data: Results for orders placed or performed in visit on 10/14/14  POCT urinalysis dipstick  Result Value Ref Range   Color, UA yellow    Clarity, UA clear    Glucose, UA negative    Bilirubin, UA negative    Ketones, UA negative    Spec Grav, UA >=1.030    Blood, UA negative    pH, UA 6.0    Protein, UA negative    Urobilinogen, UA 0.2    Nitrite, UA negative    Leukocytes, UA Negative      Assessment and Plan:   Abdominal pain, left lower quadrant  Urinary frequency - Plan: POCT urinalysis dipstick, Urine culture  Bilateral low back pain without sciatica  >  25 minutes spent in face to face time with patient, >50% spent in counselling or coordination of care:  The patient's LEFT lower quadrant abdominal pain is gone at this point.  The likelihood that it is anything serious or significant is incredibly low.  Perfectly normal examination.  Potentially this could have been ovulation, or she could have had a ovarian cyst.  Follow this clinically.  All discussed with the patient.  She also has some musculoskeletal back pain.  This is not consistent with pain from the kidneys.  Recommended moist heat, massage, warm bath, as well as nonsteroidal anti-inflammatory drugs over the  next week or 2.  Follow-up: No Follow-up on file.  New Prescriptions   No medications on file   Orders Placed This Encounter  Procedures  . Urine culture  . POCT urinalysis dipstick    Signed,  Karleen HampshireSpencer T. Albie Bazin, MD   Patient's Medications  New Prescriptions   No medications on file  Previous Medications   ACETAMINOPHEN (TYLENOL) 500 MG TABLET    Take 1,000 mg by mouth every 6 (six) hours as needed for mild pain or fever.    ALPRAZOLAM (XANAX) 0.25 MG TABLET    Take 1 tablet (0.25 mg total) by mouth 2 (two) times daily as needed for anxiety.   CHOLECALCIFEROL (VITAMIN D) 1000 UNITS TABLET    Take 1,000 Units by mouth daily.   ETONOGESTREL-ETHINYL ESTRADIOL (NUVARING) 0.12-0.015 MG/24HR VAGINAL RING    Insert vaginally and leave in place for 3 consecutive weeks, then remove for 1 week.   IBUPROFEN (ADVIL,MOTRIN) 200 MG TABLET    Take 400 mg by mouth every 6 (six) hours as needed for fever, headache or mild pain.   LORCASERIN HCL 10 MG TABS    Take 1 tablet by mouth 2 (two) times daily.  Modified Medications   No medications on file  Discontinued Medications   No medications on file

## 2014-10-14 NOTE — Progress Notes (Signed)
Pre visit review using our clinic review tool, if applicable. No additional management support is needed unless otherwise documented below in the visit note. 

## 2014-10-16 ENCOUNTER — Encounter: Payer: Self-pay | Admitting: Family Medicine

## 2014-10-16 LAB — URINE CULTURE

## 2014-10-16 MED ORDER — SULFAMETHOXAZOLE-TRIMETHOPRIM 400-80 MG PO TABS: 1.0000 | ORAL_TABLET | Freq: Two times a day (BID) | ORAL | Status: DC

## 2014-10-16 NOTE — Telephone Encounter (Signed)
addressed by Dr Patsy Lageropland earlier today.

## 2014-10-16 NOTE — Addendum Note (Signed)
Addended by: Damita LackLORING, Sondos Wolfman S on: 10/16/2014 01:04 PM   Modules accepted: Orders

## 2015-01-29 ENCOUNTER — Other Ambulatory Visit: Payer: Self-pay | Admitting: Family Medicine

## 2015-01-29 MED ORDER — ALPRAZOLAM 0.25 MG PO TABS: 0.2500 mg | ORAL_TABLET | Freq: Two times a day (BID) | ORAL | Status: DC | PRN

## 2015-01-29 NOTE — Telephone Encounter (Signed)
Rx called in as directed.   

## 2015-01-29 NOTE — Telephone Encounter (Signed)
plz phone in. 

## 2015-03-24 ENCOUNTER — Ambulatory Visit (INDEPENDENT_AMBULATORY_CARE_PROVIDER_SITE_OTHER): Payer: BC Managed Care – PPO | Admitting: Primary Care

## 2015-03-24 ENCOUNTER — Encounter: Payer: Self-pay | Admitting: Primary Care

## 2015-03-24 VITALS — BP 128/80 | HR 100 | Temp 98.8°F | Ht 62.5 in | Wt 263.8 lb

## 2015-03-24 DIAGNOSIS — R1032 Left lower quadrant pain: Secondary | ICD-10-CM | POA: Diagnosis not present

## 2015-03-24 DIAGNOSIS — N760 Acute vaginitis: Secondary | ICD-10-CM | POA: Insufficient documentation

## 2015-03-24 LAB — POCT URINE PREGNANCY: Preg Test, Ur: NEGATIVE

## 2015-03-24 MED ORDER — DOXYCYCLINE HYCLATE 100 MG PO TABS: 100.0000 mg | ORAL_TABLET | Freq: Two times a day (BID) | ORAL | Status: DC

## 2015-03-24 NOTE — Patient Instructions (Signed)
Start Doxycycline tablets for the vaginal abscess. Take one tablet by mouth twice daily for 10 days. Complete lab work prior to leaving today. I will notify you of your results. Complete your CT scan tomorrow if possible. Drop by and speak with Shirlee LimerickMarion prior to leaving to get this scheduled. I will call you when I receive these results. If you experience nausea and vomiting, fevers, blood in your stool, or worsening pain please notify me immediately and go to the emergency department.  I'll be in touch tomorrow.

## 2015-03-24 NOTE — Assessment & Plan Note (Signed)
Very tender to LLQ. Afebrile. No RLQ. Does not have gall bladder. Negative GERD symptoms. CBC, CMP, Lipase, Urine Preg, CT abdomen to rule out diverticulitis or pelvic abnormalities. She will go for CT tomorrow as she cannot go this afternoon.

## 2015-03-24 NOTE — Progress Notes (Signed)
Pre visit review using our clinic review tool, if applicable. No additional management support is needed unless otherwise documented below in the visit note. 

## 2015-03-24 NOTE — Progress Notes (Signed)
Subjective:    Patient ID: Gabrielle Leach, female    DOB: 15-Oct-1986, 29 y.o.   MRN: [id number]  HPI  Gabrielle Leach is a 29 year old female who presents today with a chief complaint left lower quadrant abdominal pain that of woke her from sleep this morning at 5am. She reports the pain feels like a constant dull pain and is worse with exertion of pressure such as coughing or taking in a deep breath. Her last bowel movement was last night and was normal.  She's felt gassy over the past couple of days, has taken tums without relief. She denies bloody stools, fevers, chills. Nothing as helped her pain, and moving around and laying flat makes it worse. She has a family history of diverticulitis. Denise RLQ abdominal pain and does not have her gall bladder.   2) Vaginal pain: Present to right side of vagina with tenderness for the past week. Denies vaginal discharge, foul odor, bleeding. Her husband told her that it looked like an infected "bump".  Review of Systems  Constitutional: Negative for fever, chills and fatigue.  Respiratory: Negative for shortness of breath.   Cardiovascular: Negative for chest pain.  Gastrointestinal: Positive for abdominal pain. Negative for nausea, vomiting, diarrhea, constipation and blood in stool.  Genitourinary: Positive for vaginal pain. Negative for dysuria, frequency, vaginal bleeding, vaginal discharge and pelvic pain.  Musculoskeletal: Negative for myalgias.  Neurological: Negative for dizziness and weakness.       Past Medical History  Diagnosis Date  . Morbid obesity   . HLD (hyperlipidemia)     borderline  . History of chicken pox   . Fatty liver   . Liver lesion   . OSA (obstructive sleep apnea) 05/21/2013    mild, set up with CPAP (Sood) does not use c -pap due to finance issues  . Rash     recurrent in axilla  . Anxiety     situatiional due to job    History   Social History  . Marital Status: Married    Spouse Name: N/A  . Number of  Children: N/A  . Years of Education: N/A   Occupational History  . Not on file.   Social History Main Topics  . Smoking status: Never Smoker   . Smokeless tobacco: Never Used  . Alcohol Use: 0.0 oz/week    0 Standard drinks or equivalent per week     Comment: occ  . Drug Use: No  . Sexual Activity: Not on file   Other Topics Concern  . Not on file   Social History Narrative   Lives with husband and son, 1 cat   Occupation: [employer] , 2nd grade teacher   Edu: BS   Activity: no regular exercise   Diet: good water, fruits/vegetables daily, diet soft drinks    Past Surgical History  Procedure Laterality Date  . Wisdom tooth extraction    . Cholecystectomy N/A 10/21/2013    chronic cholecystitis; Procedure: LAPAROSCOPIC CHOLECYSTECTOMY WITH INTRAOPERATIVE CHOLANGIOGRAM;  Surgeon: Ernestene MentionHaywood M Ingram, MD    Family History  Problem Relation Age of Onset  . CAD Paternal Grandmother 552  . CAD Paternal Grandfather 3255  . Parkinson's disease Paternal Grandfather   . CAD Maternal Grandmother 45    thinks heart related  . Diabetes Maternal Grandmother   . Diabetes Father     borderline  . Diabetes Maternal Uncle   . Cancer Other     breast - great grandmother  .  Cancer Other     liver - great grandfather  . Hypertension Paternal Aunt   . Heart disease Mother     "hardening of arteries"  . Cancer Cousin 40    breast    No Known Allergies  Current Outpatient Prescriptions on File Prior to Visit  Medication Sig Dispense Refill  . acetaminophen (TYLENOL) 500 MG tablet Take 1,000 mg by mouth every 6 (six) hours as needed for mild pain or fever.     . ALPRAZolam (XANAX) 0.25 MG tablet Take 1 tablet (0.25 mg total) by mouth 2 (two) times daily as needed for anxiety. 30 tablet 0  . cholecalciferol (VITAMIN D) 1000 UNITS tablet Take 1,000 Units by mouth daily.    Marland Kitchen etonogestrel-ethinyl estradiol (NUVARING) 0.12-0.015 MG/24HR vaginal ring Insert vaginally and leave in  place for 3 consecutive weeks, then remove for 1 week. 1 each 12  . ibuprofen (ADVIL,MOTRIN) 200 MG tablet Take 400 mg by mouth every 6 (six) hours as needed for fever, headache or mild pain.     No current facility-administered medications on file prior to visit.    BP 128/80 mmHg  Pulse 100  Temp(Src) 98.8 F (37.1 C) (Oral)  Ht 5' 2.5" (1.588 m)  Wt 263 lb 12.8 oz (119.659 kg)  BMI 47.45 kg/m2  SpO2 99%  LMP 02/21/2015    Objective:   Physical Exam  Constitutional: She is oriented to person, place, and time. She appears well-developed.  Neck: Neck supple.  Cardiovascular: Normal rate and regular rhythm.   Pulmonary/Chest: Effort normal and breath sounds normal.  Abdominal: Soft. Bowel sounds are normal. She exhibits no mass. There is no splenomegaly or hepatomegaly. There is tenderness in the left lower quadrant. There is no rebound, no guarding and no tenderness at McBurney's point.  Moderately tender to LLQ. No RLQ pain.  Lymphadenopathy:    She has no cervical adenopathy.  Neurological: She is alert and oriented to person, place, and time.  Skin: Skin is warm and dry.  Psychiatric: She has a normal mood and affect.          Assessment & Plan:

## 2015-03-24 NOTE — Assessment & Plan Note (Signed)
1/2 cm abscess noted to right upper labia majora. Does not appear to be a bartholin cyst. RX for Doxycycline, 10 days. Follow up if no improvement.

## 2015-03-25 ENCOUNTER — Ambulatory Visit (INDEPENDENT_AMBULATORY_CARE_PROVIDER_SITE_OTHER)
Admission: RE | Admit: 2015-03-25 | Discharge: 2015-03-25 | Disposition: A | Discharge status: Home / Self Care | Payer: BC Managed Care – PPO | Source: Ambulatory Visit | Attending: Primary Care | Admitting: Primary Care

## 2015-03-25 ENCOUNTER — Other Ambulatory Visit: Payer: Self-pay | Admitting: Primary Care

## 2015-03-25 ENCOUNTER — Telehealth: Payer: Self-pay | Admitting: *Deleted

## 2015-03-25 DIAGNOSIS — K5732 Diverticulitis of large intestine without perforation or abscess without bleeding: Secondary | ICD-10-CM

## 2015-03-25 DIAGNOSIS — R1032 Left lower quadrant pain: Secondary | ICD-10-CM | POA: Diagnosis not present

## 2015-03-25 LAB — CBC WITH DIFFERENTIAL/PLATELET
BASOS PCT: 0.4 % (ref 0.0–3.0)
Basophils Absolute: 0 10*3/uL (ref 0.0–0.1)
Eosinophils Absolute: 0 10*3/uL (ref 0.0–0.7)
Eosinophils Relative: 0.3 % (ref 0.0–5.0)
HCT: 40.2 % (ref 36.0–46.0)
Hemoglobin: 13.5 g/dL (ref 12.0–15.0)
Lymphocytes Relative: 17.4 % (ref 12.0–46.0)
Lymphs Abs: 2.1 10*3/uL (ref 0.7–4.0)
MCHC: 33.7 g/dL (ref 30.0–36.0)
MCV: 89.1 fl (ref 78.0–100.0)
Monocytes Absolute: 0.8 10*3/uL (ref 0.1–1.0)
Monocytes Relative: 6.9 % (ref 3.0–12.0)
NEUTROS ABS: 9.1 10*3/uL — AB (ref 1.4–7.7)
NEUTROS PCT: 75 % (ref 43.0–77.0)
Platelets: 298 10*3/uL (ref 150.0–400.0)
RBC: 4.51 Mil/uL (ref 3.87–5.11)
RDW: 13.4 % (ref 11.5–15.5)
WBC: 12.1 10*3/uL — AB (ref 4.0–10.5)

## 2015-03-25 LAB — COMPREHENSIVE METABOLIC PANEL
ALK PHOS: 65 U/L (ref 39–117)
ALT: 42 U/L — AB (ref 0–35)
AST: 25 U/L (ref 0–37)
Albumin: 4.2 g/dL (ref 3.5–5.2)
BUN: 9 mg/dL (ref 6–23)
CALCIUM: 9.3 mg/dL (ref 8.4–10.5)
CHLORIDE: 101 meq/L (ref 96–112)
CO2: 26 mEq/L (ref 19–32)
CREATININE: 0.76 mg/dL (ref 0.40–1.20)
GFR: 94.75 mL/min (ref >60.00)
Glucose, Bld: 76 mg/dL (ref 70–99)
Potassium: 3.8 mEq/L (ref 3.5–5.1)
Sodium: 135 mEq/L (ref 135–145)
Total Bilirubin: 0.5 mg/dL (ref 0.2–1.2)
Total Protein: 7.1 g/dL (ref 6.0–8.3)

## 2015-03-25 LAB — LIPID PANEL
CHOL/HDL RATIO: 4
Cholesterol: 217 mg/dL — ABNORMAL HIGH (ref 0–200)
HDL: 61.7 mg/dL (ref >39.00)
LDL Cholesterol: 141 mg/dL — ABNORMAL HIGH (ref 0–99)
NonHDL: 155.3
Triglycerides: 72 mg/dL (ref 0.0–149.0)
VLDL: 14.4 mg/dL (ref 0.0–40.0)

## 2015-03-25 MED ORDER — CIPROFLOXACIN HCL 500 MG PO TABS: 500.0000 mg | ORAL_TABLET | Freq: Two times a day (BID) | ORAL | Status: DC

## 2015-03-25 MED ORDER — IOHEXOL 300 MG/ML  SOLN
100.0000 mL | Freq: Once | INTRAMUSCULAR | Status: AC | PRN
  Administered 2015-03-25: 100 mL via INTRAVENOUS

## 2015-03-25 MED ORDER — METRONIDAZOLE 500 MG PO TABS
500.0000 mg | ORAL_TABLET | Freq: Three times a day (TID) | ORAL | Status: DC
Start: 2015-03-25 — End: 2015-03-30

## 2015-03-25 NOTE — Telephone Encounter (Signed)
Call report [facility] Erskine SquibbJane [facility]:  CT abd/pelvis: Focal diverticulitis distal descending colon.  No abscess.

## 2015-03-30 ENCOUNTER — Telehealth: Payer: Self-pay | Admitting: *Deleted

## 2015-03-30 DIAGNOSIS — K5792 Diverticulitis of intestine, part unspecified, without perforation or abscess without bleeding: Secondary | ICD-10-CM

## 2015-03-30 MED ORDER — AMOXICILLIN-POT CLAVULANATE 875-125 MG PO TABS: 1.0000 | ORAL_TABLET | Freq: Two times a day (BID) | ORAL | Status: DC

## 2015-03-30 NOTE — Telephone Encounter (Signed)
Patient left a voicemail stating that she was diagnosed with diverticulitis last week and has been on medication. Patient stated that she had a fever yesterday and is having to leave work today because of vomiting and wants to know if this is normal?  Patient wants to know when she may feel normal again?

## 2015-03-30 NOTE — Telephone Encounter (Signed)
Spoke with Ms. Landing regarding her reactions and believe they are related to the metronidazole. She denies alcohol consumption. Discontinued metronidazole and will replace with Augmentin. Patient notified of changes and verbalized understanding. She is to call me in 2 days with an update on her status.

## 2015-07-08 ENCOUNTER — Other Ambulatory Visit: Payer: Self-pay | Admitting: Family Medicine

## 2016-01-10 ENCOUNTER — Other Ambulatory Visit: Payer: Self-pay | Admitting: Family Medicine

## 2016-01-17 ENCOUNTER — Encounter: Payer: Self-pay | Admitting: Family Medicine

## 2016-02-10 ENCOUNTER — Encounter: Payer: Self-pay | Admitting: Family Medicine

## 2016-02-10 ENCOUNTER — Ambulatory Visit (INDEPENDENT_AMBULATORY_CARE_PROVIDER_SITE_OTHER): Payer: BC Managed Care – PPO | Admitting: Family Medicine

## 2016-02-10 ENCOUNTER — Other Ambulatory Visit: Payer: Self-pay | Admitting: Primary Care

## 2016-02-10 VITALS — BP 110/80 | HR 100 | Temp 98.6°F | Ht 62.5 in | Wt 266.2 lb

## 2016-02-10 DIAGNOSIS — K5792 Diverticulitis of intestine, part unspecified, without perforation or abscess without bleeding: Secondary | ICD-10-CM | POA: Diagnosis not present

## 2016-02-10 DIAGNOSIS — R1012 Left upper quadrant pain: Secondary | ICD-10-CM

## 2016-02-10 DIAGNOSIS — Z3009 Encounter for other general counseling and advice on contraception: Secondary | ICD-10-CM | POA: Diagnosis not present

## 2016-02-10 LAB — POCT URINE PREGNANCY: Preg Test, Ur: NEGATIVE

## 2016-02-10 MED ORDER — AMOXICILLIN-POT CLAVULANATE 875-125 MG PO TABS: 1.0000 | ORAL_TABLET | Freq: Two times a day (BID) | ORAL | Status: DC

## 2016-02-10 MED ORDER — ETONOGESTREL-ETHINYL ESTRADIOL 0.12-0.015 MG/24HR VA RING: VAGINAL_RING | VAGINAL | Status: DC

## 2016-02-10 NOTE — Telephone Encounter (Signed)
App[facility].

## 2016-02-10 NOTE — Progress Notes (Signed)
[facility] [place] [address], [address] [place], [place] [address] [phone] [phone]  Date:  02/10/2016   Name:  Gabrielle RouteKeshia E Leach   DOB:  Apr 08, 1986   MRN:  [id number]  PCP:  Eustaquio BoydenJavier Gutierrez, MD    Chief Complaint: Pain   History of Present Illness:  Gabrielle Leach is a 30 y.o. very pleasant female patient who presents with the following:  History of diverticulitis first dx on CT in April for 2016. She was treated with abx- she did well with augmentin and her sx resolved.   Yesterday around 11 am she felt a cramping pain that is centered in the LLQ. It very much reminds her of when she had diverticulitis last year.  Feels like "gas pains."    No vomiting She has not noted any fevers.  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 She ate a little soup today and has been drinking fluids She has not noted any diarrhea or constipation.   She feels a little worse than she did yesterday  No urinary or vaginal sx.   She does not suspect pregnancy History of cholecystectomy, no other operation on her belly  BP Readings from Last 3 Encounters:  02/10/16 100/80  03/24/15 128/80  10/14/14 108/80   Pulse Readings from Last 3 Encounters:  02/10/16 110  03/24/15 100  10/14/14 70     Patient Active Problem List   Diagnosis Date Noted  . Abdominal pain, left lower quadrant 03/24/2015  . Abscess of vagina 03/24/2015  . Unspecified vitamin D deficiency 05/24/2014  . Fatty liver   . Liver lesion   . Situational anxiety 07/24/2013  . OSA (obstructive sleep apnea) 05/21/2013  . Morbid obesity (HCC)   . HLD (hyperlipidemia)     Past Medical History  Diagnosis Date  . Morbid obesity (HCC)   . HLD (hyperlipidemia)     borderline  . History of chicken pox   . Fatty liver   . Liver lesion   . OSA (obstructive sleep apnea) 05/21/2013    mild, set up with CPAP (Sood) does not use c -pap due  to finance issues  . Rash     recurrent in axilla  . Anxiety     situatiional due to job    Past Surgical History  Procedure Laterality Date  . Wisdom tooth extraction    . Cholecystectomy N/A 10/21/2013    chronic cholecystitis; Procedure: LAPAROSCOPIC CHOLECYSTECTOMY WITH INTRAOPERATIVE CHOLANGIOGRAM;  Surgeon: Ernestene MentionHaywood M Ingram, MD    Social History  Substance Use Topics  . Smoking status: Never Smoker   . Smokeless tobacco: Never Used  . Alcohol Use: 0.0 oz/week    0 Standard drinks or equivalent per week     Comment: occ    Family History  Problem Relation Age of Onset  . CAD Paternal Grandmother 1452  . CAD Paternal Grandfather 6055  . Parkinson's disease Paternal Grandfather   . CAD Maternal Grandmother 45    thinks heart related  . Diabetes Maternal Grandmother   . Diabetes Father     borderline  . Diabetes Maternal Uncle   . Cancer Other     breast - great grandmother  . Cancer Other     liver - great grandfather  . Hypertension Paternal Aunt   . Heart disease Mother     "hardening of arteries"  .  Cancer Cousin 40    breast    No Known Allergies  Medication list has been reviewed and updated.  Current Outpatient Prescriptions on File Prior to Visit  Medication Sig Dispense Refill  . acetaminophen (TYLENOL) 500 MG tablet Take 1,000 mg by mouth every 6 (six) hours as needed for mild pain or fever.     . ALPRAZolam (XANAX) 0.25 MG tablet Take 1 tablet (0.25 mg total) by mouth 2 (two) times daily as needed for anxiety. 30 tablet 0  . ibuprofen (ADVIL,MOTRIN) 200 MG tablet Take 400 mg by mouth every 6 (six) hours as needed for fever, headache or mild pain.    . cholecalciferol (VITAMIN D) 1000 UNITS tablet Take 1,000 Units by mouth daily. Reported on 02/10/2016    . etonogestrel-ethinyl estradiol (NUVARING) 0.12-0.015 MG/24HR vaginal ring Insert vaginally and leave in place for 3 consecutive weeks, then remove for one week. **MUST HAVE PHYSICAL FOR FURTHER  REFILLS** (Patient not taking: Reported on 02/10/2016) 1 each 0   No current facility-administered medications on file prior to visit.    Review of Systems:  As per HPI- otherwise negative.  Physical Examination: Filed Vitals:   02/10/16 1710  BP: 100/80  Pulse: 110  Temp: 98.6 F (37 C)   Filed Vitals:   02/10/16 1710  Height: 5' 2.5" (1.588 m)  Weight: 266 lb 3.2 oz (120.748 kg)   Body mass index is 47.88 kg/(m^2). Ideal Body Weight: Weight in (lb) to have BMI = 25: 138.6  GEN: WDWN, NAD, Non-toxic, A & O x 3, quite obese, looks well.  Here today with her young son HEENT: Atraumatic, Normocephalic. Neck supple. No masses, No LAD.  Bilateral TM wnl, oropharynx normal.  PEERL,EOMI.   Ears and Nose: No external deformity. CV: RRR, No M/G/R. No JVD. No thrill. No extra heart sounds. PULM: CTA B, no wheezes, crackles, rhonchi. No retractions. No resp. distress. No accessory muscle use. ABD: S,  ND, +BS. No rebound. No HSM.  Mild left mid abd tenderness to palpation EXTR: No c/c/e NEURO Normal gait.  PSYCH: Normally interactive. Conversant. Not depressed or anxious appearing.  Calm demeanor.   Results for orders placed or performed in visit on 02/10/16  POCT urine pregnancy  Result Value Ref Range   Preg Test, Ur Negative Negative   Assessment and Plan: Abdominal pain, left upper quadrant - Plan: POCT urine pregnancy, CBC with Differential/Platelet, Comprehensive metabolic panel, Lipase, amoxicillin-clavulanate (AUGMENTIN) 875-125 MG tablet  Acute diverticulitis - Plan: amoxicillin-clavulanate (AUGMENTIN) 875-125 MG tablet  Encounter for other general counseling or advice on contraception - Plan: etonogestrel-ethinyl estradiol (NUVARING) 0.12-0.015 MG/24HR vaginal ring  Here today with likely recurrence of diverticulitis.  She is non- toxic and looks well.  Start on augmentin, liquid or low residue diet, labs pending.  She will seek care if not improved in the next 1-2 days-  to ER if worsening She needs a refill of her NR as she has run out- refilled this for her, she will start after her next MP which is due soon  Signed Abbe Amsterdam, MD

## 2016-02-10 NOTE — Progress Notes (Signed)
Pre visit review using our clinic review tool, if applicable. No additional management support is needed unless otherwise documented below in the visit note. 

## 2016-02-10 NOTE — Telephone Encounter (Signed)
Message left for patient to call and schedule appt

## 2016-02-10 NOTE — Patient Instructions (Signed)
You likely have divderticulitis. I will let you know if your labs indicate anything else!  Start on the augmentin asap Stick to an easy to digest, soft diet.  Liquids only may be best for the next day or so Do be sure to drink plenty of fluids, and let me know if you are not improving over the next 1-2 days- Sooner if worse.   If you have any severe or worsening pain go to the ER

## 2016-02-10 NOTE — Telephone Encounter (Signed)
plz call and schedule appt with next available provider this week for evaluation of abd pain. Needs to be seen this week.

## 2016-02-11 LAB — CBC WITH DIFFERENTIAL/PLATELET
BASOS ABS: 0.1 10*3/uL (ref 0.0–0.1)
Basophils Relative: 0.6 % (ref 0.0–3.0)
Eosinophils Absolute: 0 10*3/uL (ref 0.0–0.7)
Eosinophils Relative: 0.1 % (ref 0.0–5.0)
HCT: 40.8 % (ref 36.0–46.0)
HEMOGLOBIN: 14 g/dL (ref 12.0–15.0)
LYMPHS ABS: 2.5 10*3/uL (ref 0.7–4.0)
Lymphocytes Relative: 16.7 % (ref 12.0–46.0)
MCHC: 34.3 g/dL (ref 30.0–36.0)
MCV: 89.2 fl (ref 78.0–100.0)
MONOS PCT: 6 % (ref 3.0–12.0)
Monocytes Absolute: 0.9 10*3/uL (ref 0.1–1.0)
NEUTROS PCT: 76.6 % (ref 43.0–77.0)
Neutro Abs: 11.7 10*3/uL — ABNORMAL HIGH (ref 1.4–7.7)
Platelets: 250 10*3/uL (ref 150.0–400.0)
RBC: 4.57 Mil/uL (ref 3.87–5.11)
RDW: 13 % (ref 11.5–15.5)
WBC: 15.3 10*3/uL — AB (ref 4.0–10.5)

## 2016-02-11 LAB — COMPREHENSIVE METABOLIC PANEL
ALBUMIN: 4.1 g/dL (ref 3.5–5.2)
ALK PHOS: 67 U/L (ref 39–117)
ALT: 30 U/L (ref 0–35)
AST: 19 U/L (ref 0–37)
BILIRUBIN TOTAL: 0.5 mg/dL (ref 0.2–1.2)
BUN: 12 mg/dL (ref 6–23)
CO2: 26 mEq/L (ref 19–32)
Calcium: 9.3 mg/dL (ref 8.4–10.5)
Chloride: 102 mEq/L (ref 96–112)
Creatinine, Ser: 0.74 mg/dL (ref 0.40–1.20)
GFR: 97.15 mL/min (ref >60.00)
GLUCOSE: 94 mg/dL (ref 70–99)
Potassium: 3.9 mEq/L (ref 3.5–5.1)
SODIUM: 136 meq/L (ref 135–145)
TOTAL PROTEIN: 7.3 g/dL (ref 6.0–8.3)

## 2016-02-11 LAB — LIPASE: Lipase: 27 U/L (ref 11.0–59.0)

## 2016-04-19 IMAGING — CT CT ABD-PELV W/ CM
2 of 4 series · 16 of 46 positions shown, 18 images · IV contrast (Omnipaque 300)
Comparison: MR abdomen August 07, 2013

CLINICAL DATA: One day history of left lower quadrant tenderness
and pain

EXAM:
CT ABDOMEN AND PELVIS WITH CONTRAST
TECHNIQUE: Multidetector CT imaging of the abdomen and pelvis was performed
using the standard protocol following bolus administration of
intravenous contrast. Oral contrast was also administered.
CONTRAST:  100mL OMNIPAQUE IOHEXOL 300 MG/ML  SOLN

[Series 2: abd/ pel 5mm · axial · 0.85mm/px · z∈[-532,-46]mm · 13 of 107 slices shown, 15 images]
[im 5/107  soft-tissue]
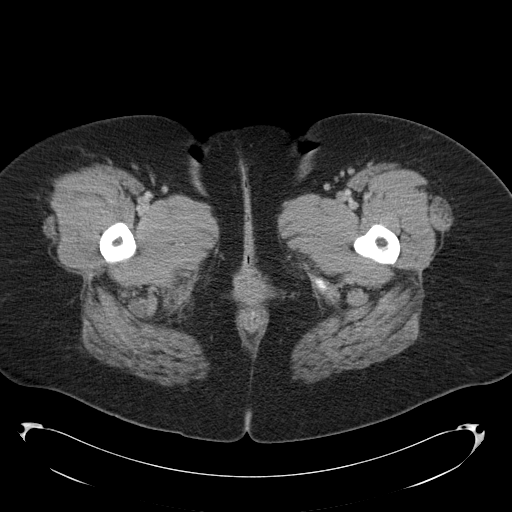
[im 5/107  bone]
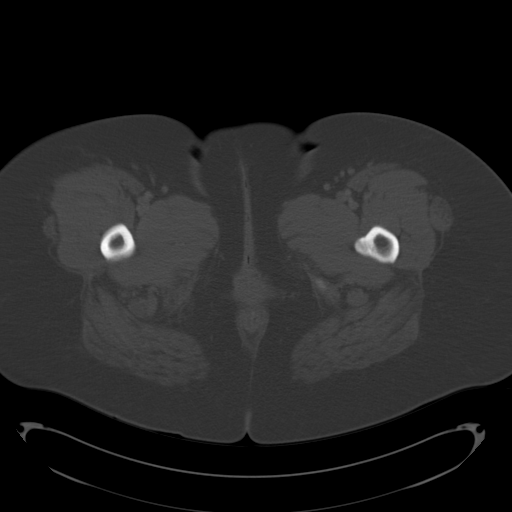
[im 13/107  soft-tissue]
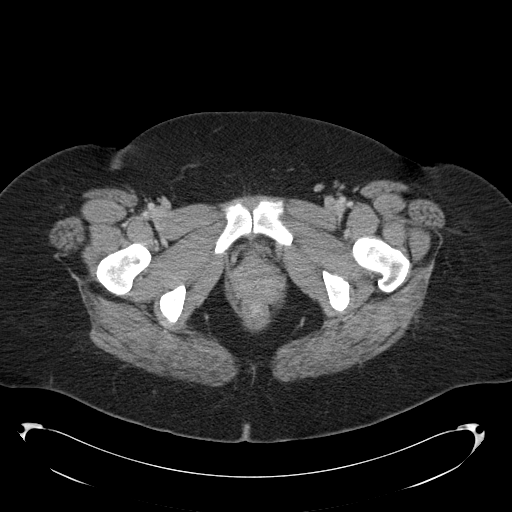
[im 22/107  soft-tissue]
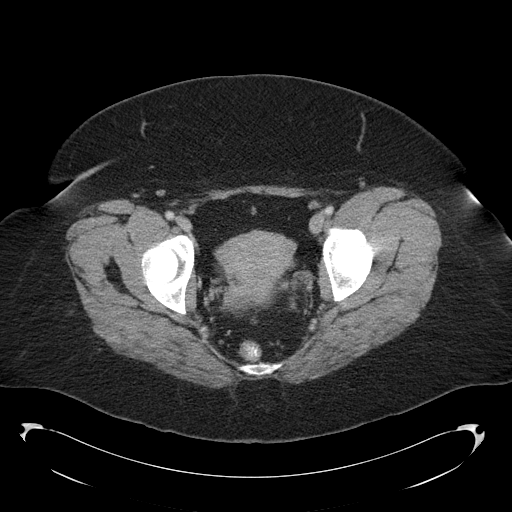
[im 30/107  soft-tissue]
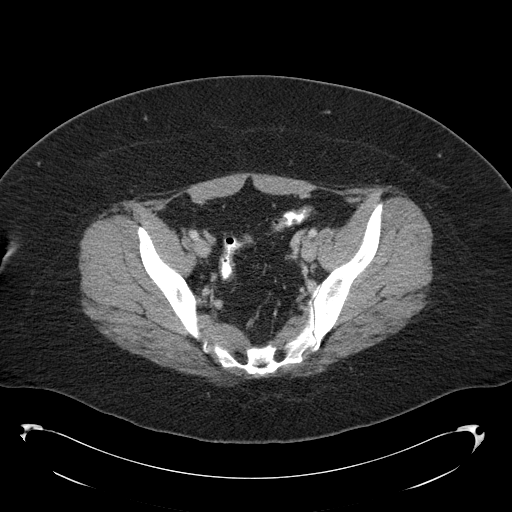
[im 39/107  soft-tissue]
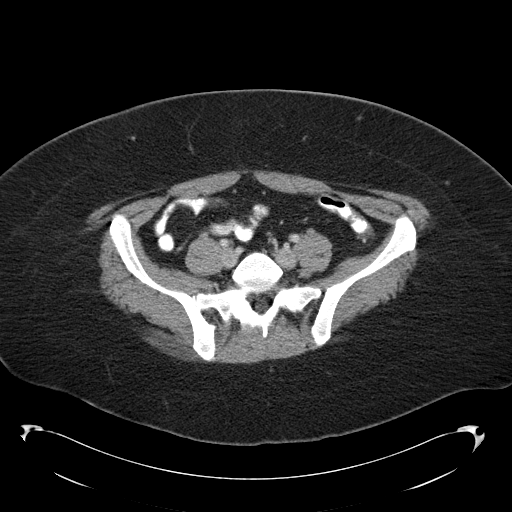
[im 47/107  soft-tissue]
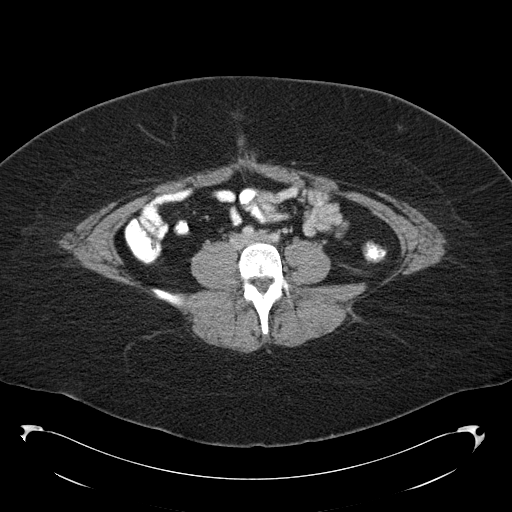
[im 56/107  soft-tissue]
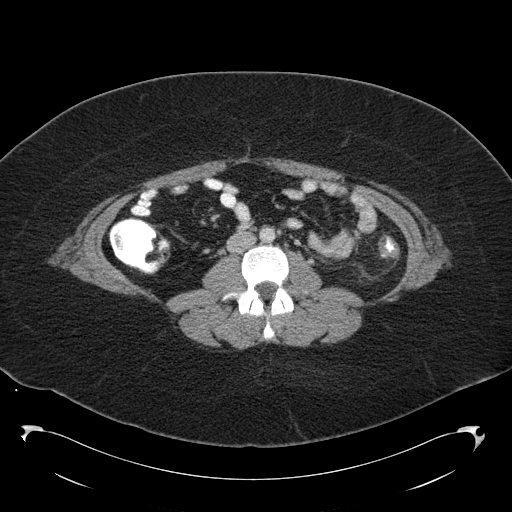
[im 60/107  soft-tissue]
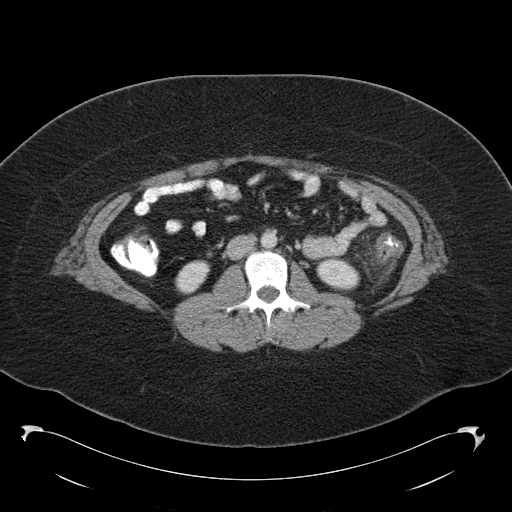
[im 68/107  soft-tissue]
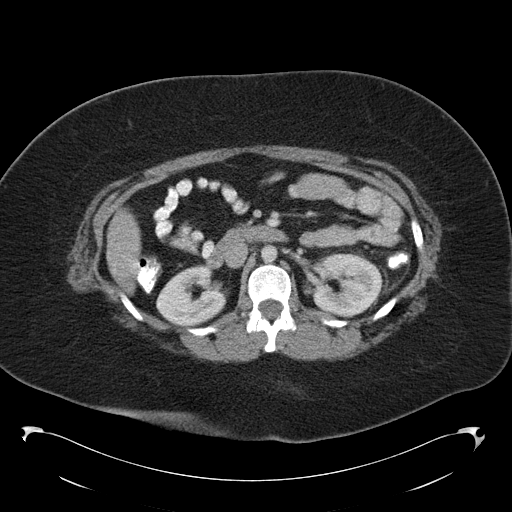
[im 68/107  bone]
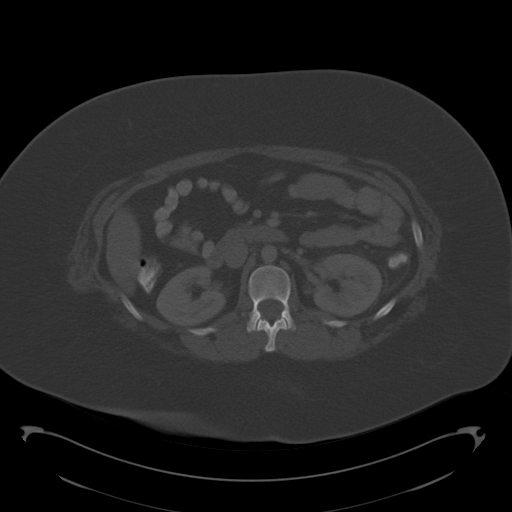
[im 77/107  soft-tissue]
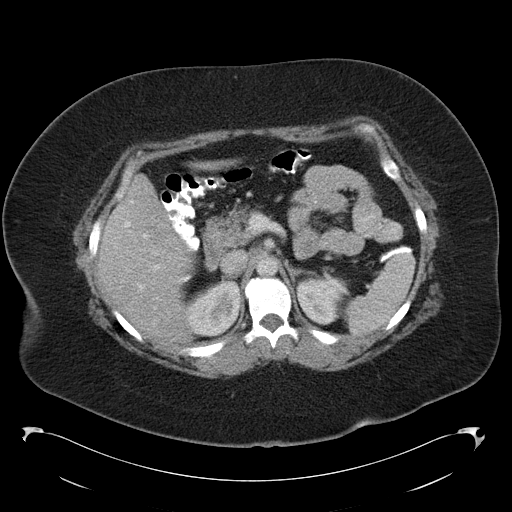
[im 85/107  soft-tissue]
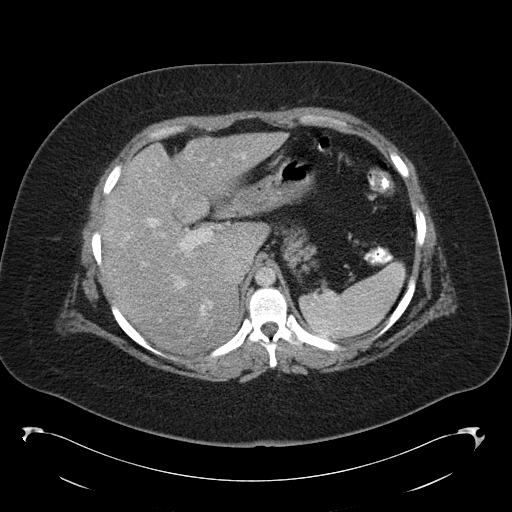
[im 94/107  soft-tissue]
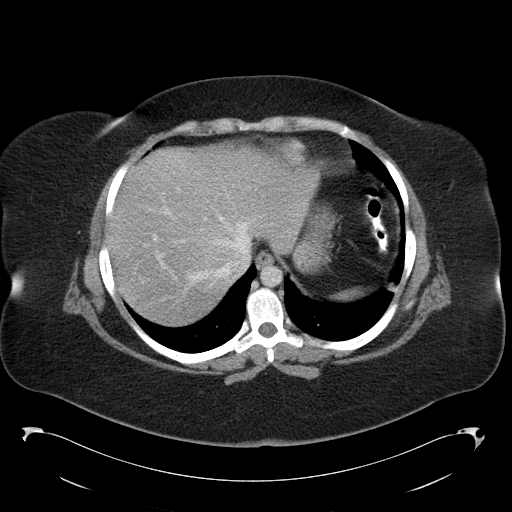
[im 102/107  soft-tissue]
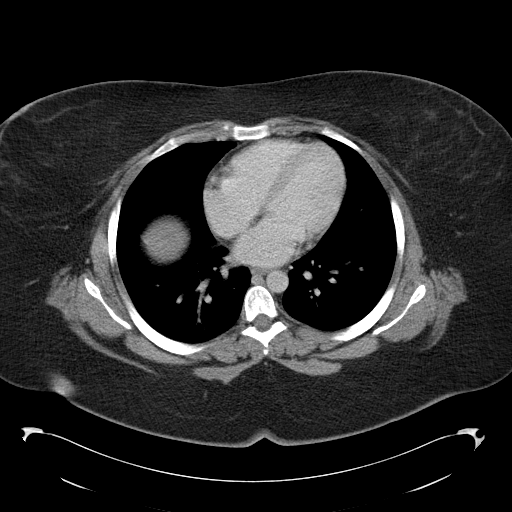

[Series 602: cor · coronal · 1.07mm/px · 3 of 146 slices shown]
[im 49/146  soft-tissue]
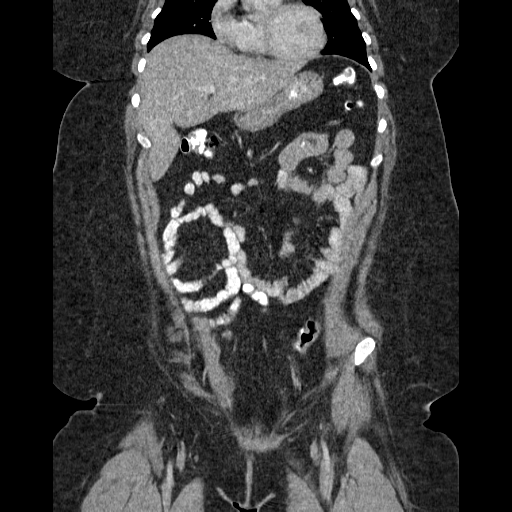
[im 65/146  soft-tissue]
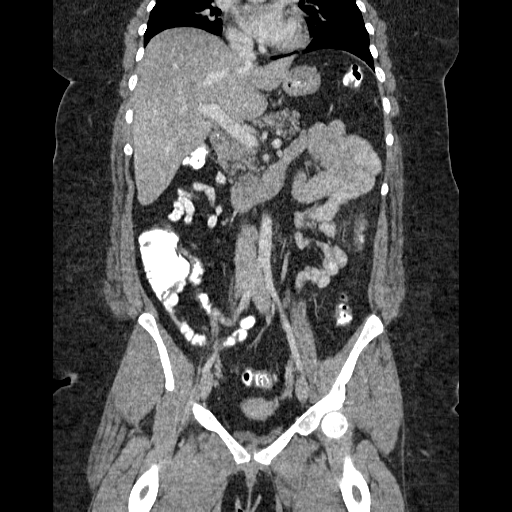
[im 81/146  soft-tissue]
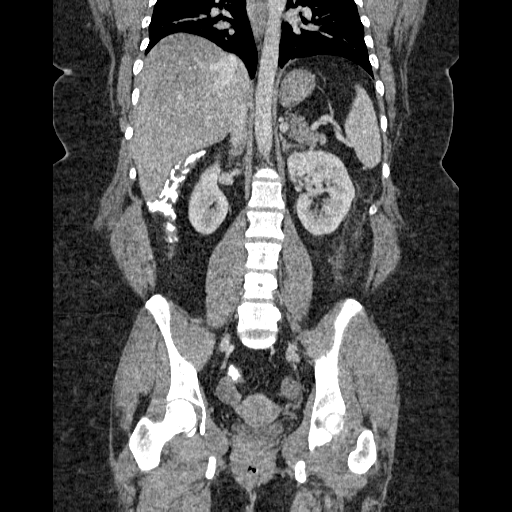

[16 of 46 positions shown; findings below may reference images not displayed]

FINDINGS: There is mild atelectatic change in the left lower lobe. Lung bases
are otherwise clear.

Liver is prominent, measuring 19.4 cm in length. There is subtle
increased attenuation in the posterior aspect of the medial segment
of the left lobe of the liver measuring 6.1 x 3.6 cm, slightly
larger than on the prior study. No new liver lesions are identified.
Gallbladder is absent. There is no biliary duct dilatation.

Spleen, pancreas, and adrenals appear normal.

Kidneys bilaterally show no mass or hydronephrosis on either side.
There is no renal or ureteral calculus on either side.

In the pelvis, there is no mass or fluid collection. Urinary bladder
is midline with normal wall thickness.

There is localized colon wall thickening with surrounding mesenteric
inflammation in the left lateral lower quadrant, slightly inferior
to the lower pole the left kidney. A single diverticulum is present
in this area. This finding is felt to represent early diverticulitis
without abscess. No other inflammation is seen in this region.

Appendix appears normal.

No bowel obstruction. No free air or portal venous air. There is no
appreciable ascites, adenopathy, or abscess in the abdomen or
pelvis. Aorta appears unremarkable. There are no blastic or lytic
bone lesions.
IMPRESSION: Evidence of diverticulitis involving the distal descending colon
with localized colon wall and mesenteric thickening but no abscess
or evidence suggesting perforation.

Subtle lesion in the medial segment of the left lobe of the liver is
again noted and appears essentially stable compared to the previous
MR although slightly larger at this time in the right to left
dimension. This area has a benign appearance. Given a slight
increase in size, however, MR correlation may be reasonable for
further assessment.

Liver is prominent, measuring 19.4 cm in length.

No abscess. No renal or ureteral calculus. No hydronephrosis.
Appendix region appears unremarkable.

These results will be called to the ordering clinician or
representative by the Radiologist Assistant, and communication
documented in the PACS or zVision Dashboard.

## 2017-01-15 ENCOUNTER — Ambulatory Visit: Payer: BC Managed Care – PPO | Admitting: Primary Care

## 2017-05-09 ENCOUNTER — Other Ambulatory Visit: Payer: Self-pay | Admitting: Family Medicine

## 2017-05-09 DIAGNOSIS — Z3009 Encounter for other general counseling and advice on contraception: Secondary | ICD-10-CM

## 2017-11-13 ENCOUNTER — Encounter: Payer: Self-pay | Admitting: Family Medicine

## 2017-11-13 ENCOUNTER — Other Ambulatory Visit: Payer: Self-pay | Admitting: Family Medicine

## 2017-11-13 DIAGNOSIS — Z3009 Encounter for other general counseling and advice on contraception: Secondary | ICD-10-CM

## 2017-11-13 NOTE — Telephone Encounter (Signed)
Received refill request for Nuvaring. Last office visit 02/10/16 and last refill 05/09/2017. Is it ok to refill? Please advise.

## 2017-11-28 ENCOUNTER — Other Ambulatory Visit: Payer: Self-pay | Admitting: Family Medicine

## 2018-04-15 ENCOUNTER — Encounter: Payer: Self-pay | Admitting: Family Medicine

## 2018-04-15 DIAGNOSIS — N390 Urinary tract infection, site not specified: Secondary | ICD-10-CM | POA: Insufficient documentation

## 2018-05-27 ENCOUNTER — Telehealth: Payer: Self-pay | Admitting: Family Medicine

## 2018-05-27 NOTE — Telephone Encounter (Signed)
plz make sure she's in a 30 min slot.  °If so, ok to make this her CPE. °thanks °

## 2018-05-27 NOTE — Telephone Encounter (Signed)
Left message asking pt to call office  °

## 2018-05-27 NOTE — Telephone Encounter (Signed)
See below crm Pt has appointment 06/11/18 for med refill and pap  Is that ok for cpx or do you want me to reschedule  Copied from CRM #[id number]. 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 Patient would like Dr. Reece AgarG to work her for a physical. Please call patient at [phone].       Thank You!!!

## 2018-05-27 NOTE — Telephone Encounter (Signed)
plz make sure she's in a 30 min slot.  If so, ok to make this her CPE. thanks

## 2018-05-29 ENCOUNTER — Telehealth: Payer: Self-pay | Admitting: Family Medicine

## 2018-05-29 NOTE — Telephone Encounter (Signed)
Lab appointment 7/12 pt aware

## 2018-05-29 NOTE — Telephone Encounter (Signed)
Copied from CRM [id number]. Topic: Quick Communication - See Telephone Encounter >> May 29, 2018 10:17 AM Gean BirchwoodWilliams-Neal, Sade R wrote: Pt is calling back from a previous call from [place]. Please call pt back

## 2018-06-06 ENCOUNTER — Other Ambulatory Visit: Payer: Self-pay | Admitting: Family Medicine

## 2018-06-06 DIAGNOSIS — E559 Vitamin D deficiency, unspecified: Secondary | ICD-10-CM

## 2018-06-06 DIAGNOSIS — E785 Hyperlipidemia, unspecified: Secondary | ICD-10-CM

## 2018-06-06 DIAGNOSIS — K760 Fatty (change of) liver, not elsewhere classified: Secondary | ICD-10-CM

## 2018-06-07 ENCOUNTER — Other Ambulatory Visit (INDEPENDENT_AMBULATORY_CARE_PROVIDER_SITE_OTHER): Payer: BC Managed Care – PPO

## 2018-06-07 DIAGNOSIS — K760 Fatty (change of) liver, not elsewhere classified: Secondary | ICD-10-CM | POA: Diagnosis not present

## 2018-06-07 DIAGNOSIS — E785 Hyperlipidemia, unspecified: Secondary | ICD-10-CM | POA: Diagnosis not present

## 2018-06-07 DIAGNOSIS — E559 Vitamin D deficiency, unspecified: Secondary | ICD-10-CM | POA: Diagnosis not present

## 2018-06-07 LAB — LIPID PANEL
CHOLESTEROL: 212 mg/dL — AB (ref 0–200)
HDL: 66.4 mg/dL (ref >39.00)
LDL CALC: 115 mg/dL — AB (ref 0–99)
NONHDL: 145.51
Total CHOL/HDL Ratio: 3
Triglycerides: 152 mg/dL — ABNORMAL HIGH (ref 0.0–149.0)
VLDL: 30.4 mg/dL (ref 0.0–40.0)

## 2018-06-07 LAB — COMPREHENSIVE METABOLIC PANEL
ALT: 12 U/L (ref 0–35)
AST: 12 U/L (ref 0–37)
Albumin: 3.7 g/dL (ref 3.5–5.2)
Alkaline Phosphatase: 43 U/L (ref 39–117)
BUN: 15 mg/dL (ref 6–23)
CALCIUM: 8.6 mg/dL (ref 8.4–10.5)
CHLORIDE: 104 meq/L (ref 96–112)
CO2: 24 mEq/L (ref 19–32)
CREATININE: 0.8 mg/dL (ref 0.40–1.20)
GFR: 87.5 mL/min (ref >60.00)
GLUCOSE: 89 mg/dL (ref 70–99)
POTASSIUM: 4.1 meq/L (ref 3.5–5.1)
Sodium: 136 mEq/L (ref 135–145)
Total Bilirubin: 0.3 mg/dL (ref 0.2–1.2)
Total Protein: 6.8 g/dL (ref 6.0–8.3)

## 2018-06-07 LAB — VITAMIN D 25 HYDROXY (VIT D DEFICIENCY, FRACTURES): VITD: 16.72 ng/mL — ABNORMAL LOW (ref 30.00–100.00)

## 2018-06-11 ENCOUNTER — Ambulatory Visit: Payer: BC Managed Care – PPO | Admitting: Family Medicine

## 2018-06-11 ENCOUNTER — Other Ambulatory Visit (HOSPITAL_COMMUNITY)
Admission: RE | Admit: 2018-06-11 | Discharge: 2018-06-11 | Disposition: A | Discharge status: Home / Self Care | Payer: BC Managed Care – PPO | Source: Ambulatory Visit | Attending: Family Medicine | Admitting: Family Medicine

## 2018-06-11 ENCOUNTER — Encounter: Payer: Self-pay | Admitting: Family Medicine

## 2018-06-11 VITALS — BP 124/84 | HR 87 | Temp 98.6°F | Ht 62.0 in | Wt 248.5 lb

## 2018-06-11 DIAGNOSIS — K769 Liver disease, unspecified: Secondary | ICD-10-CM

## 2018-06-11 DIAGNOSIS — Z0001 Encounter for general adult medical examination with abnormal findings: Secondary | ICD-10-CM | POA: Insufficient documentation

## 2018-06-11 DIAGNOSIS — K760 Fatty (change of) liver, not elsewhere classified: Secondary | ICD-10-CM | POA: Diagnosis not present

## 2018-06-11 DIAGNOSIS — E559 Vitamin D deficiency, unspecified: Secondary | ICD-10-CM

## 2018-06-11 DIAGNOSIS — Z8719 Personal history of other diseases of the digestive system: Secondary | ICD-10-CM

## 2018-06-11 DIAGNOSIS — E785 Hyperlipidemia, unspecified: Secondary | ICD-10-CM

## 2018-06-11 DIAGNOSIS — Z1151 Encounter for screening for human papillomavirus (HPV): Secondary | ICD-10-CM | POA: Diagnosis not present

## 2018-06-11 DIAGNOSIS — Z3009 Encounter for other general counseling and advice on contraception: Secondary | ICD-10-CM | POA: Diagnosis not present

## 2018-06-11 MED ORDER — MULTIVITAMIN WOMEN PO TABS: 1.0000 | ORAL_TABLET | Freq: Every day | ORAL | Status: DC

## 2018-06-11 MED ORDER — VITAMIN D3 1.25 MG (50000 UT) PO TABS: 1.0000 | ORAL_TABLET | ORAL | 1 refills | Status: DC

## 2018-06-11 MED ORDER — ETONOGESTREL-ETHINYL ESTRADIOL 0.12-0.015 MG/24HR VA RING: VAGINAL_RING | VAGINAL | 3 refills | Status: DC

## 2018-06-11 MED ORDER — VITAMIN D 50 MCG (2000 UT) PO CAPS: 1.0000 | ORAL_CAPSULE | Freq: Every day | ORAL | Status: DC

## 2018-06-11 NOTE — Assessment & Plan Note (Addendum)
Preventative protocols reviewed and updated unless pt declined. Discussed healthy diet and lifestyle.  Well woman with pap today.  nuvaring refilled.

## 2018-06-11 NOTE — Patient Instructions (Addendum)
Work on high [employer] to help prevent diverticulitis. Consider colonoscopy especially if recurrent episode.  Start vitamin D 50,000 units weekly for replacement.  Start women's multivitamin (biotin is good for hair and nail health).  Look into contrave for weight loss. Let me know if you'd like to start it and if we do, return 1 month after starting for follow up visit.  nuvaring refilled.  Bupropion; Naltrexone extended-release tablets What is this medicine? BUPROPION; NALTREXONE (byoo PROE pee on; nal TREX one) is a combination product used to promote and maintain weight loss in obese adults or overweight adults who also have weight related medical problems. This medicine should be used with a reduced calorie diet and increased physical activity. This medicine may be used for other purposes; ask your health care provider or pharmacist if you have questions. COMMON BRAND NAME(S): CONTRAVE What should I tell my health care provider before I take this medicine? They need to know if you have any of these conditions: -an eating disorder, such as anorexia or bulimia -bipolar disorder -diabetes -depression -drug abuse or addiction -glaucoma -head injury -heart disease -high blood pressure -history of a tumor or infection of your brain or spine -history of stroke -history of irregular heartbeat -if you often drink alcohol -kidney disease -liver disease -schizophrenia -seizures -suicidal thoughts, plans, or attempt; a previous suicide attempt by you or a family member -an unusual or allergic reaction to bupropion, naltrexone, other medicines, foods, dyes, or preservatives -breast-feeding -pregnant or trying to become pregnant How should I use this medicine? Take this medicine by mouth with a glass of water. Follow the directions on the prescription label. Take this medicine in the morning and in the evenings as directed by your healthcare professional. Dennis Bast can take it with or without  food. Do not take with high-fat meals as this may increase your risk of seizures. Do not crush, chew, or cut these tablets. Do not take your medicine more often than directed. Do not stop taking this medicine suddenly except upon the advice of your doctor. A special MedGuide will be given to you by the pharmacist with each prescription and refill. Be sure to read this information carefully each time. Talk to your pediatrician regarding the use of this medicine in children. Special care may be needed. Overdosage: If you think you have taken too much of this medicine contact a poison control center or emergency room at once. NOTE: This medicine is only for you. Do not share this medicine with others. What if I miss a dose? If you miss a dose, skip the missed dose and take your next tablet at the regular time. Do not take double or extra doses. What may interact with this medicine? Do not take this medicine with any of the following medications: -any prescription or street opioid drug like codeine, heroin, methadone -linezolid -MAOIs like Carbex, Eldepryl, Marplan, Nardil, and Parnate -methylene blue (injected into a vein) -other medicines that contain bupropion like Zyban or Wellbutrin This medicine may also interact with the following medications: -alcohol -certain medicines for anxiety or sleep -certain medicines for blood pressure like metoprolol, propranolol -certain medicines for depression or psychotic disturbances -certain medicines for HIV or AIDS like efavirenz, lopinavir, nelfinavir, ritonavir -certain medicines for irregular heart beat like propafenone, flecainide -certain medicines for Parkinson's disease like amantadine, levodopa -certain medicines for seizures like carbamazepine, phenytoin, phenobarbital -cimetidine -clopidogrel -cyclophosphamide -digoxin -disulfiram -furazolidone -isoniazid -nicotine -orphenadrine -procarbazine -steroid medicines like prednisone or  cortisone -stimulant medicines for  attention disorders, weight loss, or to stay awake -tamoxifen -theophylline -thioridazine -thiotepa -ticlopidine -tramadol -warfarin This list may not describe all possible interactions. Give your health care provider a list of all the medicines, herbs, non-prescription drugs, or dietary supplements you use. Also tell them if you smoke, drink alcohol, or use illegal drugs. Some items may interact with your medicine. What should I watch for while using this medicine? This medicine is intended to be used in addition to a healthy diet and appropriate exercise. The best results are achieved this way. Do not increase or in any way change your dose without consulting your doctor or health care professional. Do not take this medicine with other prescription or over-the-counter weight loss products without consulting your doctor or health care professional. Your doctor should tell you to stop taking this medicine if you do not lose a certain amount of weight within the first 12 weeks of treatment. Visit your doctor or health care professional for regular checkups. Your doctor may order blood tests or other tests to see how you are doing. This medicine may affect blood sugar levels. If you have diabetes, check with your doctor or health care professional before you change your diet or the dose of your diabetic medicine. Patients and their families should watch out for new or worsening depression or thoughts of suicide. Also watch out for sudden changes in feelings such as feeling anxious, agitated, panicky, irritable, hostile, aggressive, impulsive, severely restless, overly excited and hyperactive, or not being able to sleep. If this happens, especially at the beginning of treatment or after a change in dose, call your health care professional. Avoid alcoholic drinks while taking this medicine. Drinking large amounts of alcoholic beverages, using sleeping or anxiety  medicines, or quickly stopping the use of these agents while taking this medicine may increase your risk for a seizure. What side effects may I notice from receiving this medicine? Side effects that you should report to your doctor or health care professional as soon as possible: -allergic reactions like skin rash, itching or hives, swelling of the face, lips, or tongue -breathing problems -changes in vision -confusion -elevated mood, decreased need for sleep, racing thoughts, impulsive behavior -fast or irregular heartbeat -hallucinations, loss of contact with reality -increased blood pressure -redness, blistering, peeling or loosening of the skin, including inside the mouth -seizures -signs and symptoms of liver injury like dark yellow or brown urine; general ill feeling or flu-like symptoms; light-colored stools; loss of appetite; nausea; right upper belly pain; unusually weak or tired; yellowing of the eyes or skin -suicidal thoughts or other mood changes -vomiting Side effects that usually do not require medical attention (report to your doctor or health care professional if they continue or are bothersome): -constipation -headache -loss of appetite -indigestion, stomach upset -tremors This list may not describe all possible side effects. Call your doctor for medical advice about side effects. You may report side effects to FDA at 1-800-FDA-1088. Where should I keep my medicine? Keep out of the reach of children. Store at room temperature between 15 and 30 degrees C (59 and 86 degrees F). Throw away any unused medicine after the expiration date. NOTE: This sheet is a summary. It may not cover all possible information. If you have questions about this medicine, talk to your doctor, pharmacist, or health care provider.  2018 Elsevier/Gold Standard (2016-05-05 13:42:58)  Health Maintenance, Female Adopting a healthy lifestyle and getting preventive care can go a long way to promote  health  and wellness. Talk with your health care provider about what schedule of regular examinations is right for you. This is a good chance for you to check in with your provider about disease prevention and staying healthy. In between checkups, there are plenty of things you can do on your own. Experts have done a lot of research about which lifestyle changes and preventive measures are most likely to keep you healthy. Ask your health care provider for more information. Weight and diet Eat a healthy diet  Be sure to include plenty of vegetables, fruits, low-fat dairy products, and lean protein.  Do not eat a lot of foods high in solid fats, added sugars, or salt.  Get regular exercise. This is one of the most important things you can do for your health. ? Most adults should exercise for at least 150 minutes each week. The exercise should increase your heart rate and make you sweat (moderate-intensity exercise). ? Most adults should also do strengthening exercises at least twice a week. This is in addition to the moderate-intensity exercise.  Maintain a healthy weight  Body mass index (BMI) is a measurement that can be used to identify possible weight problems. It estimates body fat based on height and weight. Your health care provider can help determine your BMI and help you achieve or maintain a healthy weight.  For females 32 years of age and older: ? A BMI below 18.5 is considered underweight. ? A BMI of 18.5 to 24.9 is normal. ? A BMI of 25 to 29.9 is considered overweight. ? A BMI of 30 and above is considered obese.  Watch levels of cholesterol and blood lipids  You should start having your blood tested for lipids and cholesterol at 34 years of age, then have this test every 5 years.  You may need to have your cholesterol levels checked more often if: ? Your lipid or cholesterol levels are high. ? You are older than 34 years of age. ? You are at high risk for heart  disease.  Cancer screening Lung Cancer  Lung cancer screening is recommended for adults 73-90 years old who are at high risk for lung cancer because of a history of smoking.  A yearly low-dose CT scan of the lungs is recommended for people who: ? Currently smoke. ? Have quit within the past 15 years. ? Have at least a 30-pack-year history of smoking. A pack year is smoking an average of one pack of cigarettes a day for 1 year.  Yearly screening should continue until it has been 15 years since you quit.  Yearly screening should stop if you develop a health problem that would prevent you from having lung cancer treatment.  Breast Cancer  Practice breast self-awareness. This means understanding how your breasts normally appear and feel.  It also means doing regular breast self-exams. Let your health care provider know about any changes, no matter how small.  If you are in your 20s or 30s, you should have a clinical breast exam (CBE) by a health care provider every 1-3 years as part of a regular health exam.  If you are 23 or older, have a CBE every year. Also consider having a breast X-ray (mammogram) every year.  If you have a family history of breast cancer, talk to your health care provider about genetic screening.  If you are at high risk for breast cancer, talk to your health care provider about having an MRI and a mammogram every year.  Breast cancer gene (BRCA) assessment is recommended for women who have family members with BRCA-related cancers. BRCA-related cancers include: ? Breast. ? Ovarian. ? Tubal. ? Peritoneal cancers.  Results of the assessment will determine the need for genetic counseling and BRCA1 and BRCA2 testing.  Cervical Cancer Your health care provider may recommend that you be screened regularly for cancer of the pelvic organs (ovaries, uterus, and vagina). This screening involves a pelvic examination, including checking for microscopic changes to the  surface of your cervix (Pap test). You may be encouraged to have this screening done every 3 years, beginning at age 63.  For women ages 33-65, health care providers may recommend pelvic exams and Pap testing every 3 years, or they may recommend the Pap and pelvic exam, combined with testing for human papilloma virus (HPV), every 5 years. Some types of HPV increase your risk of cervical cancer. Testing for HPV may also be done on women of any age with unclear Pap test results.  Other health care providers may not recommend any screening for nonpregnant women who are considered low risk for pelvic cancer and who do not have symptoms. Ask your health care provider if a screening pelvic exam is right for you.  If you have had past treatment for cervical cancer or a condition that could lead to cancer, you need Pap tests and screening for cancer for at least 20 years after your treatment. If Pap tests have been discontinued, your risk factors (such as having a new sexual partner) need to be reassessed to determine if screening should resume. Some women have medical problems that increase the chance of getting cervical cancer. In these cases, your health care provider may recommend more frequent screening and Pap tests.  Colorectal Cancer  This type of cancer can be detected and often prevented.  Routine colorectal cancer screening usually begins at 34 years of age and continues through 34 years of age.  Your health care provider may recommend screening at an earlier age if you have risk factors for colon cancer.  Your health care provider may also recommend using home test kits to check for hidden blood in the stool.  A small camera at the end of a tube can be used to examine your colon directly (sigmoidoscopy or colonoscopy). This is done to check for the earliest forms of colorectal cancer.  Routine screening usually begins at age 47.  Direct examination of the colon should be repeated every 5-10  years through 34 years of age. However, you may need to be screened more often if early forms of precancerous polyps or small growths are found.  Skin Cancer  Check your skin from head to toe regularly.  Tell your health care provider about any new moles or changes in moles, especially if there is a change in a mole's shape or color.  Also tell your health care provider if you have a mole that is larger than the size of a pencil eraser.  Always use sunscreen. Apply sunscreen liberally and repeatedly throughout the day.  Protect yourself by wearing long sleeves, pants, a wide-brimmed hat, and sunglasses whenever you are outside.  Heart disease, diabetes, and high blood pressure  High blood pressure causes heart disease and increases the risk of stroke. High blood pressure is more likely to develop in: ? People who have blood pressure in the high end of the normal range (130-139/85-89 mm Hg). ? People who are overweight or obese. ? People who are African American.  If you are 51-56 years of age, have your blood pressure checked every 3-5 years. If you are 11 years of age or older, have your blood pressure checked every year. You should have your blood pressure measured twice-once when you are at a hospital or clinic, and once when you are not at a hospital or clinic. Record the average of the two measurements. To check your blood pressure when you are not at a hospital or clinic, you can use: ? An automated blood pressure machine at a pharmacy. ? A home blood pressure monitor.  If you are between 69 years and 74 years old, ask your health care provider if you should take aspirin to prevent strokes.  Have regular diabetes screenings. This involves taking a blood sample to check your fasting blood sugar level. ? If you are at a normal weight and have a low risk for diabetes, have this test once every three years after 34 years of age. ? If you are overweight and have a high risk for  diabetes, consider being tested at a younger age or more often. Preventing infection Hepatitis B  If you have a higher risk for hepatitis B, you should be screened for this virus. You are considered at high risk for hepatitis B if: ? You were born in a country where hepatitis B is common. Ask your health care provider which countries are considered high risk. ? Your parents were born in a high-risk country, and you have not been immunized against hepatitis B (hepatitis B vaccine). ? You have HIV or AIDS. ? You use needles to inject street drugs. ? You live with someone who has hepatitis B. ? You have had sex with someone who has hepatitis B. ? You get hemodialysis treatment. ? You take certain medicines for conditions, including cancer, organ transplantation, and autoimmune conditions.  Hepatitis C  Blood testing is recommended for: ? Everyone born from [birth year] through 1967. ? Anyone with known risk factors for hepatitis C.  Sexually transmitted infections (STIs)  You should be screened for sexually transmitted infections (STIs) including gonorrhea and chlamydia if: ? You are sexually active and are younger than 34 years of age. ? You are older than 34 years of age and your health care provider tells you that you are at risk for this type of infection. ? Your sexual activity has changed since you were last screened and you are at an increased risk for chlamydia or gonorrhea. Ask your health care provider if you are at risk.  If you do not have HIV, but are at risk, it may be recommended that you take a prescription medicine daily to prevent HIV infection. This is called pre-exposure prophylaxis (PrEP). You are considered at risk if: ? You are sexually active and do not regularly use condoms or know the HIV status of your partner(s). ? You take drugs by injection. ? You are sexually active with a partner who has HIV.  Talk with your health care provider about whether you are at high risk  of being infected with HIV. If you choose to begin PrEP, you should first be tested for HIV. You should then be tested every 3 months for as long as you are taking PrEP. Pregnancy  If you are premenopausal and you may become pregnant, ask your health care provider about preconception counseling.  If you may become pregnant, take 400 to 800 micrograms (mcg) of folic acid every day.  If you want to prevent pregnancy, talk  to your health care provider about birth control (contraception). Osteoporosis and menopause  Osteoporosis is a disease in which the bones lose minerals and strength with aging. This can result in serious bone fractures. Your risk for osteoporosis can be identified using a bone density scan.  If you are 53 years of age or older, or if you are at risk for osteoporosis and fractures, ask your health care provider if you should be screened.  Ask your health care provider whether you should take a calcium or vitamin D supplement to lower your risk for osteoporosis.  Menopause may have certain physical symptoms and risks.  Hormone replacement therapy may reduce some of these symptoms and risks. Talk to your health care provider about whether hormone replacement therapy is right for you. Follow these instructions at home:  Schedule regular health, dental, and eye exams.  Stay current with your immunizations.  Do not use any tobacco products including cigarettes, chewing tobacco, or electronic cigarettes.  If you are pregnant, do not drink alcohol.  If you are breastfeeding, limit how much and how often you drink alcohol.  Limit alcohol intake to no more than 1 drink per day for nonpregnant women. One drink equals 12 ounces of beer, 5 ounces of wine, or 1 ounces of hard liquor.  Do not use street drugs.  Do not share needles.  Ask your health care provider for help if you need support or information about quitting drugs.  Tell your health care provider if you often  feel depressed.  Tell your health care provider if you have ever been abused or do not feel safe at home. This information is not intended to replace advice given to you by your health care provider. Make sure you discuss any questions you have with your health care provider. Document Released: 05/29/2011 Document Revised: 04/20/2016 Document Reviewed: 08/17/2015 Elsevier Interactive Patient Education  [employer].

## 2018-06-11 NOTE — Assessment & Plan Note (Addendum)
By CT/MRI. Prominent liver. LFTs stable.

## 2018-06-11 NOTE — Assessment & Plan Note (Signed)
Trig mildly elevated, other lipid levels actually improved. Reviewed diet choices to improve triglyceride levels.

## 2018-06-11 NOTE — Progress Notes (Signed)
BP 124/84 (BP Location: Right Arm, Patient Position: Sitting, Cuff Size: Large)   Pulse 87   Temp 98.6 F (37 C) (Oral)   Ht 5\' 2"  (1.575 m)   Wt 248 lb 8 oz (112.7 kg)   LMP 05/20/2018   SpO2 100%   BMI 45.45 kg/m    CC: CPE Subjective:    Patient ID: Gabrielle Leach, female    DOB: 02-Nov-1986, 32 y.o.   MRN: [id number]  HPI: Gabrielle Leach is a 32 y.o. female presenting on 06/11/2018 for Annual Exam (Needs Pap smear.)   I last saw patient 2015.   Noticing more fever blisters with stress.   More skin tags noted around neck.   Has had 2 diverticulitis flares in last 3 years. No flares in the last year. Healthier diet has helped.   Obesity - following weight watchers online. Noticing hair falling out over the past 1.5 yrs. Did not like taking topamax - word finding difficulty even at low dose.   Preventative: Well woman - last 2014 with normal pap smear 2014. Due for this. Contraception - trouble remembering daily OCP - on nuvaring since 2015 and prior as well. Requests nuvaring refilled.  Flu shot - has not gotten Tdap - 06/2011 Seat belt use discussed Sunscreen use discussed. No changing moles on skin Non smoker Alcohol - 1-2 times a week Dentist - yearly Eye exam - due - doesn't wear her glasses  LMP - 4 wks ago, regular. nuva ring in place for 3 wks.   Lives with husband and son, 1 cat Occupation: Forensic psychologist , 2nd grade teacher  Edu: BS  Activity: no regular exercise trying to walk in evenings Diet: good water, fruits/vegetables daily  Relevant past medical, surgical, family and social history reviewed and updated as indicated. Interim medical history since our last visit reviewed. Allergies and medications reviewed and updated. Outpatient Medications Prior to Visit  Medication Sig Dispense Refill  . acetaminophen (TYLENOL) 500 MG tablet Take 1,000 mg by mouth every 6 (six) hours as needed for mild pain or fever.     Marland Kitchen ibuprofen (ADVIL,MOTRIN) 200 MG tablet  Take 400 mg by mouth every 6 (six) hours as needed for fever, headache or mild pain.    Marland Kitchen ALPRAZolam (XANAX) 0.25 MG tablet Take 1 tablet (0.25 mg total) by mouth 2 (two) times daily as needed for anxiety. 30 tablet 0  . amoxicillin-clavulanate (AUGMENTIN) 875-125 MG tablet Take 1 tablet by mouth 2 (two) times daily. 20 tablet 0  . cholecalciferol (VITAMIN D) 1000 UNITS tablet Take 1,000 Units by mouth daily. Reported on 02/10/2016    . NUVARING 0.12-0.015 MG/24HR vaginal ring INSERT VAGINALLY AND LEAVE IN PLACE FOR 3 CONSECUTIVE WEEKS, THEN REMOVE FOR ONE WEEK. 3 each 0   No facility-administered medications prior to visit.      Per HPI unless specifically indicated in ROS section below Review of Systems  Constitutional: Negative for activity change, appetite change, chills, fatigue, fever and unexpected weight change.  HENT: Negative for hearing loss.   Eyes: Negative for visual disturbance.  Respiratory: Negative for cough, chest tightness, shortness of breath and wheezing.   Cardiovascular: Negative for chest pain, palpitations and leg swelling.  Gastrointestinal: Negative for abdominal distention, abdominal pain, blood in stool, constipation, diarrhea, nausea and vomiting.  Genitourinary: Negative for difficulty urinating and hematuria.  Musculoskeletal: Negative for arthralgias, myalgias and neck pain.  Skin: Negative for rash.  Neurological: Negative for dizziness, seizures, syncope and headaches.  Hematological: Negative for adenopathy. Does not bruise/bleed easily.  Psychiatric/Behavioral: Negative for dysphoric mood. The patient is not nervous/anxious.        Objective:    BP 124/84 (BP Location: Right Arm, Patient Position: Sitting, Cuff Size: Large)   Pulse 87   Temp 98.6 F (37 C) (Oral)   Ht 5\' 2"  (1.575 m)   Wt 248 lb 8 oz (112.7 kg)   LMP 05/20/2018   SpO2 100%   BMI 45.45 kg/m   Wt Readings from Last 3 Encounters:  06/11/18 248 lb 8 oz (112.7 kg)  02/10/16 266  lb 3.2 oz (120.7 kg)  03/24/15 263 lb 12.8 oz (119.7 kg)    Physical Exam  Constitutional: She is oriented to person, place, and time. She appears well-developed and well-nourished. No distress.  HENT:  Head: Normocephalic and atraumatic.  Right Ear: Hearing, tympanic membrane, external ear and ear canal normal.  Left Ear: Hearing, tympanic membrane, external ear and ear canal normal.  Nose: Nose normal.  Mouth/Throat: Uvula is midline, oropharynx is clear and moist and mucous membranes are normal. No oropharyngeal exudate, posterior oropharyngeal edema or posterior oropharyngeal erythema.  Eyes: Pupils are equal, round, and reactive to light. Conjunctivae and EOM are normal. No scleral icterus.  Neck: Normal range of motion. Neck supple. No thyromegaly present.  Cardiovascular: Normal rate, regular rhythm, normal heart sounds and intact distal pulses.  No murmur heard. Pulses:      Radial pulses are 2+ on the right side, and 2+ on the left side.  Pulmonary/Chest: Effort normal and breath sounds normal. No respiratory distress. She has no wheezes. She has no rales.  Abdominal: Soft. Bowel sounds are normal. She exhibits no distension and no mass. There is no tenderness. There is no rebound and no guarding.  Genitourinary: Vagina normal and uterus normal. Pelvic exam was performed with patient supine. There is no rash, tenderness, lesion or injury on the right labia. There is no rash, tenderness, lesion or injury on the left labia. Cervix exhibits friability. Cervix exhibits no motion tenderness and no discharge. Right adnexum displays no mass, no tenderness and no fullness. Left adnexum displays no mass, no tenderness and no fullness. No tenderness in the vagina. No vaginal discharge found.  Genitourinary Comments: Pap performed on friable erythematous cervix   Musculoskeletal: Normal range of motion. She exhibits no edema.  Lymphadenopathy:    She has no cervical adenopathy.  Neurological:  She is alert and oriented to person, place, and time.  CN grossly intact, station and gait intact  Skin: Skin is warm and dry. No rash noted.  Psychiatric: She has a normal mood and affect. Her behavior is normal. Judgment and thought content normal.  Nursing note and vitals reviewed.  Results for orders placed or performed in visit on 06/07/18  VITAMIN D 25 Hydroxy (Vit-D Deficiency, Fractures)  Result Value Ref Range   VITD 16.72 (L) 30.00 - 100.00 ng/mL  Comprehensive metabolic panel  Result Value Ref Range   Sodium 136 135 - 145 mEq/L   Potassium 4.1 3.5 - 5.1 mEq/L   Chloride 104 96 - 112 mEq/L   CO2 24 19 - 32 mEq/L   Glucose, Bld 89 70 - 99 mg/dL   BUN 15 6 - 23 mg/dL   Creatinine, Ser 1.61 0.40 - 1.20 mg/dL   Total Bilirubin 0.3 0.2 - 1.2 mg/dL   Alkaline Phosphatase 43 39 - 117 U/L   AST 12 0 - 37 U/L   ALT  12 0 - 35 U/L   Total Protein 6.8 6.0 - 8.3 g/dL   Albumin 3.7 3.5 - 5.2 g/dL   Calcium 8.6 8.4 - 96.010.5 mg/dL   GFR 45.4087.50 >98.11>60.00 mL/min  Lipid panel  Result Value Ref Range   Cholesterol 212 (H) 0 - 200 mg/dL   Triglycerides 914.7152.0 (H) 0.0 - 149.0 mg/dL   HDL 82.9566.40 >62.13>39.00 mg/dL   VLDL 08.630.4 0.0 - 57.840.0 mg/dL   LDL Cholesterol 469115 (H) 0 - 99 mg/dL   Total CHOL/HDL Ratio 3    NonHDL 145.51       Assessment & Plan:   Problem List Items Addressed This Visit    Vitamin D deficiency    Worsened deficiency. Forgets replacement. rec start 50,000 IU weekly - sent to pharmacy. Pt agrees with plan.       Obesity, morbid, BMI 40.0-49.9 (HCC)    Discussed options. Will research contrave - handout provided today. If started, rec return 1 mo f/u visit weight. Continue online weight watchers. Continue healthy diet choices.       Liver lesion    Benign MRI 2014 (focal fatty sparing vs focal nodular hyperplasia), stable CT 2016.       HLD (hyperlipidemia)    Trig mildly elevated, other lipid levels actually improved. Reviewed diet choices to improve triglyceride levels.        History of colonic diverticulitis    2 episodes in last 2-3 yrs. Recommended colonoscopy - she declines at this time. Doing better with healthy diet changes. Discussed high fiber diet for prevention. If recurrent, agrees to GI referral.       Fatty liver    By CT/MRI. Prominent liver. LFTs stable.      Encounter for general adult medical examination with abnormal findings - Primary    Preventative protocols reviewed and updated unless pt declined. Discussed healthy diet and lifestyle.  Well woman with pap today.  nuvaring refilled.        Other Visit Diagnoses    Encounter for other general counseling or advice on contraception       Relevant Medications   etonogestrel-ethinyl estradiol (NUVARING) 0.12-0.015 MG/24HR vaginal ring       Meds ordered this encounter  Medications  . etonogestrel-ethinyl estradiol (NUVARING) 0.12-0.015 MG/24HR vaginal ring    Sig: INSERT VAGINALLY AND LEAVE IN PLACE FOR 3 CONSECUTIVE WEEKS, THEN REMOVE FOR ONE WEEK.    Dispense:  3 each    Refill:  3  . DISCONTD: Cholecalciferol (VITAMIN D) 2000 units CAPS    Sig: Take 1 capsule (2,000 Units total) by mouth daily.    Dispense:  30 capsule  . Multiple Vitamins-Minerals (MULTIVITAMIN WOMEN) TABS    Sig: Take 1 tablet by mouth daily.  . Cholecalciferol (VITAMIN D3) 50000 units TABS    Sig: Take 1 tablet by mouth once a week.    Dispense:  12 tablet    Refill:  1   No orders of the defined types were placed in this encounter.   Follow up plan: Return in about 1 year (around 06/12/2019) for annual exam, prior fasting for blood work.  Eustaquio BoydenJavier Victormanuel Mclure, MD

## 2018-06-11 NOTE — Addendum Note (Signed)
Addended by: Nanci PinaGOINS, Jasminne Mealy on: 06/11/2018 02:14 PM   Modules accepted: Orders

## 2018-06-11 NOTE — Assessment & Plan Note (Addendum)
2 episodes in last 2-3 yrs. Recommended colonoscopy - she declines at this time. Doing better with healthy diet changes. Discussed high fiber diet for prevention. If recurrent, agrees to GI referral.

## 2018-06-11 NOTE — Assessment & Plan Note (Signed)
Benign MRI 2014 (focal fatty sparing vs focal nodular hyperplasia), stable CT 2016.

## 2018-06-11 NOTE — Assessment & Plan Note (Signed)
Discussed options. Will research contrave - handout provided today. If started, rec return 1 mo f/u visit weight. Continue online weight watchers. Continue healthy diet choices.

## 2018-06-11 NOTE — Assessment & Plan Note (Addendum)
Worsened deficiency. Forgets replacement. rec start 50,000 IU weekly - sent to pharmacy. Pt agrees with plan.

## 2018-06-12 LAB — CYTOLOGY - PAP
DIAGNOSIS: NEGATIVE
HPV (WINDOPATH): NOT DETECTED

## 2018-06-18 ENCOUNTER — Encounter: Payer: Self-pay | Admitting: Family Medicine

## 2018-06-18 DIAGNOSIS — N888 Other specified noninflammatory disorders of cervix uteri: Secondary | ICD-10-CM

## 2018-06-23 NOTE — Telephone Encounter (Signed)
Referring to GYN for abnormal cervix on pelvic exam, despite normal pap smear results.

## 2018-07-09 ENCOUNTER — Encounter: Payer: Self-pay | Admitting: Family Medicine

## 2018-07-09 DIAGNOSIS — N86 Erosion and ectropion of cervix uteri: Secondary | ICD-10-CM | POA: Insufficient documentation

## 2018-08-18 ENCOUNTER — Encounter: Payer: Self-pay | Admitting: Family Medicine

## 2018-08-18 DIAGNOSIS — E559 Vitamin D deficiency, unspecified: Secondary | ICD-10-CM

## 2018-08-18 DIAGNOSIS — R5383 Other fatigue: Secondary | ICD-10-CM

## 2018-11-10 ENCOUNTER — Other Ambulatory Visit: Payer: Self-pay | Admitting: Family Medicine

## 2019-01-03 ENCOUNTER — Ambulatory Visit: Payer: BC Managed Care – PPO | Admitting: Family Medicine

## 2019-01-26 ENCOUNTER — Other Ambulatory Visit: Payer: Self-pay | Admitting: Family Medicine

## 2019-04-15 ENCOUNTER — Other Ambulatory Visit: Payer: Self-pay | Admitting: Family Medicine

## 2019-04-15 DIAGNOSIS — Z3009 Encounter for other general counseling and advice on contraception: Secondary | ICD-10-CM

## 2019-04-16 NOTE — Telephone Encounter (Signed)
NuvaRing Last filled:  02/02/19, #3 ea Last OV:  06/11/18, CPE Next OV: 06/17/19, CPE

## 2019-06-10 ENCOUNTER — Other Ambulatory Visit (INDEPENDENT_AMBULATORY_CARE_PROVIDER_SITE_OTHER): Payer: BC Managed Care – PPO

## 2019-06-10 ENCOUNTER — Other Ambulatory Visit: Payer: Self-pay

## 2019-06-10 DIAGNOSIS — R5383 Other fatigue: Secondary | ICD-10-CM

## 2019-06-10 DIAGNOSIS — E559 Vitamin D deficiency, unspecified: Secondary | ICD-10-CM | POA: Diagnosis not present

## 2019-06-10 LAB — CBC WITH DIFFERENTIAL/PLATELET
Basophils Absolute: 0 10*3/uL (ref 0.0–0.1)
Basophils Relative: 0.6 % (ref 0.0–3.0)
Eosinophils Absolute: 0.1 10*3/uL (ref 0.0–0.7)
Eosinophils Relative: 1 % (ref 0.0–5.0)
HCT: 39.7 % (ref 36.0–46.0)
Hemoglobin: 13.3 g/dL (ref 12.0–15.0)
Lymphocytes Relative: 34.4 % (ref 12.0–46.0)
Lymphs Abs: 2.8 10*3/uL (ref 0.7–4.0)
MCHC: 33.4 g/dL (ref 30.0–36.0)
MCV: 90.5 fl (ref 78.0–100.0)
Monocytes Absolute: 0.4 10*3/uL (ref 0.1–1.0)
Monocytes Relative: 5.5 % (ref 3.0–12.0)
Neutro Abs: 4.8 10*3/uL (ref 1.4–7.7)
Neutrophils Relative %: 58.5 % (ref 43.0–77.0)
Platelets: 276 10*3/uL (ref 150.0–400.0)
RBC: 4.39 Mil/uL (ref 3.87–5.11)
RDW: 12.7 % (ref 11.5–15.5)
WBC: 8.2 10*3/uL (ref 4.0–10.5)

## 2019-06-10 LAB — TSH: TSH: 3.85 u[IU]/mL (ref 0.35–4.50)

## 2019-06-10 LAB — VITAMIN B12: Vitamin B-12: 197 pg/mL — ABNORMAL LOW (ref 211–911)

## 2019-06-10 LAB — VITAMIN D 25 HYDROXY (VIT D DEFICIENCY, FRACTURES): VITD: 49.79 ng/mL (ref 30.00–100.00)

## 2019-06-17 ENCOUNTER — Telehealth (INDEPENDENT_AMBULATORY_CARE_PROVIDER_SITE_OTHER): Payer: BC Managed Care – PPO | Admitting: Family Medicine

## 2019-06-17 ENCOUNTER — Encounter: Payer: Self-pay | Admitting: Family Medicine

## 2019-06-17 DIAGNOSIS — F419 Anxiety disorder, unspecified: Secondary | ICD-10-CM | POA: Diagnosis not present

## 2019-06-17 DIAGNOSIS — Z0001 Encounter for general adult medical examination with abnormal findings: Secondary | ICD-10-CM

## 2019-06-17 DIAGNOSIS — E538 Deficiency of other specified B group vitamins: Secondary | ICD-10-CM

## 2019-06-17 DIAGNOSIS — E559 Vitamin D deficiency, unspecified: Secondary | ICD-10-CM

## 2019-06-17 DIAGNOSIS — E785 Hyperlipidemia, unspecified: Secondary | ICD-10-CM | POA: Diagnosis not present

## 2019-06-17 DIAGNOSIS — Z3009 Encounter for other general counseling and advice on contraception: Secondary | ICD-10-CM

## 2019-06-17 MED ORDER — ETONOGESTREL-ETHINYL ESTRADIOL 0.12-0.015 MG/24HR VA RING: 1.0000 | VAGINAL_RING | VAGINAL | 3 refills | Status: AC

## 2019-06-17 MED ORDER — D3-50 1.25 MG (50000 UT) PO CAPS: ORAL_CAPSULE | ORAL | 3 refills | Status: DC

## 2019-06-17 MED ORDER — ALPRAZOLAM 0.25 MG PO TABS: 0.1250 mg | ORAL_TABLET | Freq: Two times a day (BID) | ORAL | 0 refills | Status: AC | PRN

## 2019-06-17 MED ORDER — B-12 1000 MCG SL SUBL: 1.0000 | SUBLINGUAL_TABLET | Freq: Every day | SUBLINGUAL | Status: AC

## 2019-06-17 NOTE — Assessment & Plan Note (Signed)
Encouraged start dissolvable b12 105mcg daily.

## 2019-06-17 NOTE — Progress Notes (Signed)
Virtual visit completed through [place]. Due to national recommendations of social distancing due to COVID-19, a virtual visit is felt to be most appropriate for this patient at this time. Reviewed limitations of a virtual visit.   Patient location: home Provider location: [facility], office If any vitals were documented, they were collected by patient at home unless specified below.    BP 139/79 (BP Location: Left Wrist, Patient Position: Sitting, Cuff Size: Normal)   Pulse 86   Ht 5\' 2"  (1.575 m)   Wt 252 lb 6.4 oz (114.5 kg)   LMP 05/27/2019 (Approximate) Comment: has Nuvaring  BMI 46.16 kg/m    CC: CPE Subjective:    Patient ID: Gabrielle Leach, female    DOB: 17-Jul-1986, 33 y.o.   MRN: [id number]  HPI: Gabrielle Leach is a 33 y.o. 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  Husband had pulm embolisms Feb, had 8 clots removed.  Increased stress with thinking about return to work (07/07/2019). Worries about getting husband ill. 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  Contraceptives - nuvaring no longer covered starting 06/2019. She requests generic (eluryng)  Preventative: Well woman - pap smear 2019 normal, HRHPV neg. Did see GYN last year for inflamed cervix - normal effect of nuvaring. Contraception - trouble remembering daily OCP - on nuvaring since 2015 and prior as well. Requests nuvaring generic refilled.  LMP - 3 wks ago, regular. nuva ring in place for 3 wks.  Flu shot - has not gotten Tdap - 06/2011  Seat belt use discussed Sunscreen use discussed. No changing moles on skin.  Non smoker Alcohol - 1-2 times a week  Dentist - yearly Eye exam - due  Lives with husband and son, 1 cat Occupation: Therapist, nutritional, 2nd grade teacher  Edu: BS  Activity: no regular exercise trying to walk in evenings Diet: good water, fruits/vegetables daily     Relevant past medical, surgical, family and social history reviewed and updated as indicated. Interim medical history since our last visit reviewed. Allergies and medications reviewed and updated. Outpatient Medications Prior to Visit  Medication Sig Dispense Refill  . acetaminophen (TYLENOL) 500 MG tablet Take 1,000 mg by mouth every 6 (six) hours as needed for mild pain or fever.     Marland Kitchen ibuprofen (ADVIL,MOTRIN) 200 MG tablet Take 400 mg by mouth every 6 (six) hours as needed for fever, headache or mild pain.    . Multiple Vitamins-Minerals (MULTIVITAMIN WOMEN) TABS Take 1 tablet by mouth daily.    . D3-50 1.25 MG (50000 UT) capsule TAKE 1 TABLET BY MOUTH ONE TIME PER WEEK 12 capsule 2  . etonogestrel-ethinyl estradiol (NUVARING) 0.12-0.015 MG/24HR vaginal ring INSERT VAGINALLY AND LEAVE IN PLACE FOR 3 CONSECUTIVE WEEKS, THEN REMOVE FOR ONE WEEK. 3 each 3  . fluticasone (FLONASE) 50 MCG/ACT nasal spray USE 1 SMALL SPRAY TO EACH NOSTRIL 2 TIMES PER DAY PRN NASAL CONGESTION (Patient not taking: Reported on 06/17/2019) 16 g 6   No facility-administered medications prior to visit.      Per HPI unless specifically indicated in ROS section below Review  of Systems  Constitutional: Negative for activity change, appetite change, chills, fatigue, fever and unexpected weight change.  HENT: Negative for hearing loss.   Eyes: Negative for visual disturbance.  Respiratory: Positive for chest tightness (anxiety related). Negative for cough, shortness of breath and wheezing.   Cardiovascular: Negative for chest pain, palpitations and leg swelling.  Gastrointestinal: Negative for abdominal distention, abdominal pain, blood in stool, constipation, diarrhea, nausea and vomiting.   Genitourinary: Negative for difficulty urinating and hematuria.  Musculoskeletal: Negative for arthralgias, myalgias and neck pain.  Skin: Negative for rash.  Neurological: Positive for headaches (occasional - stress related). Negative for dizziness, seizures and syncope.  Hematological: Negative for adenopathy. Does not bruise/bleed easily.  Psychiatric/Behavioral: Positive for dysphoric mood. The patient is nervous/anxious.        Increased stressors   Objective:    BP 139/79 (BP Location: Left Wrist, Patient Position: Sitting, Cuff Size: Normal)   Pulse 86   Ht 5\' 2"  (1.575 m)   Wt 252 lb 6.4 oz (114.5 kg)   LMP 05/27/2019 (Approximate) Comment: has Nuvaring  BMI 46.16 kg/m   Wt Readings from Last 3 Encounters:  06/17/19 252 lb 6.4 oz (114.5 kg)  06/11/18 248 lb 8 oz (112.7 kg)  02/10/16 266 lb 3.2 oz (120.7 kg)     Physical exam: Gen: alert, NAD, not ill appearing Pulm: speaks in complete sentences without increased work of breathing Psych: normal mood, normal thought content      Results for orders placed or performed in visit on 06/10/19  TSH  Result Value Ref Range   TSH 3.85 0.35 - 4.50 uIU/mL  CBC with Differential/Platelet  Result Value Ref Range   WBC 8.2 4.0 - 10.5 K/uL   RBC 4.39 3.87 - 5.11 Mil/uL   Hemoglobin 13.3 12.0 - 15.0 g/dL   HCT 16.139.7 09.636.0 - 04.546.0 %   MCV 90.5 78.0 - 100.0 fl   MCHC 33.4 30.0 - 36.0 g/dL   RDW 40.912.7 81.111.5 - 91.415.5 %   Platelets 276.0 150.0 - 400.0 K/uL   Neutrophils Relative % 58.5 43.0 - 77.0 %   Lymphocytes Relative 34.4 12.0 - 46.0 %   Monocytes Relative 5.5 3.0 - 12.0 %   Eosinophils Relative 1.0 0.0 - 5.0 %   Basophils Relative 0.6 0.0 - 3.0 %   Neutro Abs 4.8 1.4 - 7.7 K/uL   Lymphs Abs 2.8 0.7 - 4.0 K/uL   Monocytes Absolute 0.4 0.1 - 1.0 K/uL   Eosinophils Absolute 0.1 0.0 - 0.7 K/uL   Basophils Absolute 0.0 0.0 - 0.1 K/uL  Vitamin B12  Result Value Ref Range   Vitamin B-12 197 (L) 211 - 911 pg/mL  VITAMIN D 25 Hydroxy  (Vit-D Deficiency, Fractures)  Result Value Ref Range   VITD 49.79 30.00 - 100.00 ng/mL   Depression screen [facility] 2/9 06/17/2019 06/17/2019 06/11/2018 02/10/2016  Decreased Interest 1 2 0 0  Down, Depressed, Hopeless 1 0 0 0  PHQ - 2 Score 2 2 0 0  Altered sleeping 1 1 - -  Tired, decreased energy 3 3 - -  Change in appetite 0 0 - -  Feeling bad or failure about yourself  0 0 - -  Trouble concentrating 2 1 - -  Moving slowly or fidgety/restless 0 0 - -  Suicidal thoughts 0 0 - -  PHQ-9 Score 8 7 - -  Difficult doing work/chores Very difficult - - -   GAD 7 : Generalized Anxiety Score 06/17/2019  Nervous, Anxious, on Edge 3  Control/stop worrying 3  Worry too much - different things 3  Trouble relaxing 2  Restless 0  Easily annoyed or irritable 1  Afraid - awful might happen 2  Total GAD 7 Score 14  Anxiety Difficulty Very difficult   Assessment & Plan:   Problem List Items Addressed This Visit    Vitamin D deficiency    Levels repleted with weekly replacement - continue.       Vitamin B12 deficiency    Encouraged start dissolvable b12 1000mcg daily.      Obesity, morbid, BMI 40.0-49.9 (HCC)    Encouraged ongoing efforts at healthy diet and lifestyle changes for sustainable weight loss. Obesity in itself is a risk factor for increased risk of severe illness from Covid19.      HLD (hyperlipidemia)    Will need rechecked next labs      Encounter for general adult medical examination with abnormal findings    Preventative protocols reviewed and updated unless pt declined. Discussed healthy diet and lifestyle.  She will drop off work accommodation request form to review.      Anxiety    Increasing anxiety over pandemic, return to work plan. Reviewed healthy stress relieving strategies - she is aware of these. Interested in PRN anxiety medication. Declines antihistamine due to oversedation. Will restart low dose xanax. Reviewed risks of controlled substance including habit  forming potential, dependence/tolerance potential, sedation.  GAD7, PHQ9 reviewed.      Relevant Medications   ALPRAZolam (XANAX) 0.25 MG tablet    Other Visit Diagnoses    Encounter for other general counseling or advice on contraception           Meds ordered this encounter  Medications  . Cholecalciferol (D3-50) 1.25 MG (50000 UT) capsule    Sig: TAKE 1 TABLET BY MOUTH ONE TIME PER WEEK    Dispense:  12 capsule    Refill:  3  . etonogestrel-ethinyl estradiol (ELURYNG) 0.12-0.015 MG/24HR vaginal ring    Sig: Place 1 each vaginally every 28 (twenty-eight) days. Insert vaginally and leave in place for 3 consecutive weeks, then remove for 1 week.    Dispense:  3 each    Refill:  3  . ALPRAZolam (XANAX) 0.25 MG tablet    Sig: Take 0.5-1 tablets (0.125-0.25 mg total) by mouth 2 (two) times daily as needed for anxiety.    Dispense:  20 tablet    Refill:  0  . Cyanocobalamin (B-12) 1000 MCG SUBL    Sig: Place 1 tablet under the tongue daily.   No orders of the defined types were placed in this encounter.   I discussed the assessment and treatment plan with the patient. The patient was provided an opportunity to ask questions and all were answered. The patient agreed with the plan and demonstrated an understanding of the instructions. The patient was advised to call back or seek an in-person evaluation if the symptoms worsen or if the condition fails to improve as anticipated.  Follow up plan: Return in about 1 year (around 06/16/2020) for annual exam, prior fasting for blood work.  Eustaquio BoydenJavier Dyron Kawano, MD

## 2019-06-17 NOTE — Assessment & Plan Note (Addendum)
Preventative protocols reviewed and updated unless pt declined. Discussed healthy diet and lifestyle.  She will drop off work accommodation request form to review.

## 2019-06-17 NOTE — Assessment & Plan Note (Addendum)
Encouraged ongoing efforts at healthy diet and lifestyle changes for sustainable weight loss. Obesity in itself is a risk factor for increased risk of severe illness from Covid19.

## 2019-06-17 NOTE — Assessment & Plan Note (Signed)
Levels repleted with weekly replacement - continue.

## 2019-06-17 NOTE — Assessment & Plan Note (Signed)
Will need rechecked next labs 

## 2019-06-17 NOTE — Assessment & Plan Note (Signed)
Increasing anxiety over pandemic, return to work plan. Reviewed healthy stress relieving strategies - she is aware of these. Interested in PRN anxiety medication. Declines antihistamine due to oversedation. Will restart low dose xanax. Reviewed risks of controlled substance including habit forming potential, dependence/tolerance potential, sedation.  GAD7, PHQ9 reviewed.

## 2019-07-08 ENCOUNTER — Other Ambulatory Visit: Payer: Self-pay | Admitting: Family Medicine

## 2019-07-09 NOTE — Telephone Encounter (Signed)
This was filled on 06/18/2019 for a year to [place].

## 2019-07-19 ENCOUNTER — Other Ambulatory Visit: Payer: Self-pay | Admitting: Family Medicine

## 2019-07-31 ENCOUNTER — Encounter: Payer: Self-pay | Admitting: Family Medicine

## 2019-07-31 ENCOUNTER — Telehealth: Payer: BC Managed Care – PPO | Admitting: Physician Assistant

## 2019-07-31 DIAGNOSIS — M549 Dorsalgia, unspecified: Secondary | ICD-10-CM

## 2019-07-31 DIAGNOSIS — R300 Dysuria: Secondary | ICD-10-CM

## 2019-07-31 NOTE — Telephone Encounter (Signed)
Spoke with pt to get her scheduled.  Says she can't come in until tomorrow.  Scheduled her for 08/01/19 at 10:15.

## 2019-07-31 NOTE — Telephone Encounter (Signed)
Pt called in to follow up on her message  Will send to Dr Synthia Innocent CMA

## 2019-07-31 NOTE — Progress Notes (Signed)
Based on what you shared with me, I feel your condition warrants further evaluation and I recommend that you be seen for a face to face office visit.  NOTE: If you entered your credit card information for this eVisit, you will not be charged. You may see a "hold" on your card for the $35 but that hold will drop off and you will not have a charge processed.  If you are having a true medical emergency please call 911.     For an urgent face to face visit, [facility] has four urgent care centers for your convenience:   . [facility]    [phone]                  Get Driving Directions  [address] [place], [place] [address] . 10 am to 8 pm Monday-Friday . 12 pm to 8 pm Saturday-Sunday   . [facility]  [phone]                  Get Driving Directions  [address], [address] [place], [place] [address] . 8 am to 8 pm Monday-Friday . 9 am to 6 pm Saturday . 11 am to 6 pm Sunday   . [facility] [place]  [phone]                  Get Driving Directions   [address] [place], [place] [address] . 8 am to 8 pm Monday-Friday . 8 am to 4 pm Saturday-Sunday    . [facility] [place]                    Get Driving Directions  [phone]  [address] [place], [place] [address]  . Monday-Friday, 12 PM to 6 PM    Your e-visit answers were reviewed by a board certified advanced clinical practitioner to complete your personal care plan.  Thank you for using e-Visits.  

## 2019-08-01 ENCOUNTER — Encounter: Payer: Self-pay | Admitting: Family Medicine

## 2019-08-01 ENCOUNTER — Other Ambulatory Visit: Payer: Self-pay

## 2019-08-01 ENCOUNTER — Ambulatory Visit: Payer: BC Managed Care – PPO | Admitting: Family Medicine

## 2019-08-01 VITALS — BP 126/80 | HR 85 | Temp 98.0°F | Ht 62.0 in | Wt 255.1 lb

## 2019-08-01 DIAGNOSIS — N3001 Acute cystitis with hematuria: Secondary | ICD-10-CM | POA: Diagnosis not present

## 2019-08-01 LAB — POC URINALSYSI DIPSTICK (AUTOMATED)
Bilirubin, UA: NEGATIVE
Glucose, UA: NEGATIVE
Ketones, UA: NEGATIVE
Nitrite, UA: NEGATIVE
Protein, UA: NEGATIVE
Spec Grav, UA: 1.015 (ref 1.010–1.025)
Urobilinogen, UA: 0.2 E.U./dL
pH, UA: 6 (ref 5.0–8.0)

## 2019-08-01 MED ORDER — CIPROFLOXACIN HCL 500 MG PO TABS: 500.0000 mg | ORAL_TABLET | Freq: Two times a day (BID) | ORAL | 0 refills | Status: DC

## 2019-08-01 NOTE — Assessment & Plan Note (Signed)
UA/micro and story consistent with this. Given endorsed R flank discomfort (not reproducible on exam), will cover with cipro 500mg  x 7 days. Update if ongoing symptoms despite treatment. UCx sent. Pt agrees with plan.

## 2019-08-01 NOTE — Progress Notes (Addendum)
This visit was conducted in person.  BP 126/80 (BP Location: Left Arm, Patient Position: Sitting, Cuff Size: Large)   Pulse 85   Temp 98 F (36.7 C) (Temporal)   Ht 5\' 2"  (1.575 m)   Wt 255 lb 1 oz (115.7 kg)   LMP 07/21/2019   SpO2 99%   BMI 46.65 kg/m    CC: UTI? Subjective:    Patient ID: Gabrielle Leach, female    DOB: 10-25-1986, 33 y.o.   MRN: [id number]  HPI: Gabrielle Leach is a 33 y.o. female presenting on 08/01/2019 for Dysuria (C/o burning with urination, urgency and right flank pain.  Started about 2 wks ago.  H/o UTI. )   1+ wk h/o pain at end of stream associated with R flank pain. Urgency, frequency.  No fevers/chills, hematuria, nausea/vomiting.  No recent abx use.  Has been holding urine more than normal.  Did have pyelonephritis as child.   Has tried cranberry juice and azo tablets.      Relevant past medical, surgical, family and social history reviewed and updated as indicated. Interim medical history since our last visit reviewed. Allergies and medications reviewed and updated. Outpatient Medications Prior to Visit  Medication Sig Dispense Refill  . acetaminophen (TYLENOL) 500 MG tablet Take 1,000 mg by mouth every 6 (six) hours as needed for mild pain or fever.     . ALPRAZolam (XANAX) 0.25 MG tablet Take 0.5-1 tablets (0.125-0.25 mg total) by mouth 2 (two) times daily as needed for anxiety. 20 tablet 0  . Cholecalciferol (VITAMIN D3) 1.25 MG (50000 UT) CAPS TAKE 1 TABLET BY MOUTH ONE TIME PER WEEK 12 capsule 2  . Cyanocobalamin (B-12) 1000 MCG SUBL Place 1 tablet under the tongue daily.    Marland Kitchen etonogestrel-ethinyl estradiol (ELURYNG) 0.12-0.015 MG/24HR vaginal ring Place 1 each vaginally every 28 (twenty-eight) days. Insert vaginally and leave in place for 3 consecutive weeks, then remove for 1 week. 3 each 3  . ibuprofen (ADVIL,MOTRIN) 200 MG tablet Take 400 mg by mouth every 6 (six) hours as needed for fever, headache or mild pain.    . Multiple  Vitamins-Minerals (MULTIVITAMIN WOMEN) TABS Take 1 tablet by mouth daily.     No facility-administered medications prior to visit.      Per HPI unless specifically indicated in ROS section below Review of Systems Objective:    BP 126/80 (BP Location: Left Arm, Patient Position: Sitting, Cuff Size: Large)   Pulse 85   Temp 98 F (36.7 C) (Temporal)   Ht 5\' 2"  (1.575 m)   Wt 255 lb 1 oz (115.7 kg)   LMP 07/21/2019   SpO2 99%   BMI 46.65 kg/m   Wt Readings from Last 3 Encounters:  08/01/19 255 lb 1 oz (115.7 kg)  06/17/19 252 lb 6.4 oz (114.5 kg)  06/11/18 248 lb 8 oz (112.7 kg)    Physical Exam Vitals signs and nursing note reviewed.  Constitutional:      General: She is not in acute distress.    Appearance: Normal appearance. She is not ill-appearing.  Abdominal:     General: Abdomen is flat. Bowel sounds are normal. There is no distension.     Palpations: Abdomen is soft. There is no mass.     Tenderness: There is no abdominal tenderness. There is no right CVA tenderness, left CVA tenderness, guarding or rebound. Negative signs include Murphy's sign.     Hernia: No hernia is present.  Neurological:  Mental Status: She is alert.       Results for orders placed or performed in visit on 08/01/19  POCT Urinalysis Dipstick (Automated)  Result Value Ref Range   Color, UA light yellow    Clarity, UA cloudy    Glucose, UA Negative Negative   Bilirubin, UA negative    Ketones, UA negative    Spec Grav, UA 1.015 1.010 - 1.025   Blood, UA 1+    pH, UA 6.0 5.0 - 8.0   Protein, UA Negative Negative   Urobilinogen, UA 0.2 0.2 or 1.0 E.U./dL   Nitrite, UA negative    Leukocytes, UA Moderate (2+) (A) Negative  Micro: WBC 10-20 RBC 3-5 Casts none Epi few Bact 1+ rods  Lab Results  Component Value Date   CREATININE 0.80 06/07/2018   BUN 15 06/07/2018   NA 136 06/07/2018   K 4.1 06/07/2018   CL 104 06/07/2018   CO2 24 06/07/2018    Assessment & Plan:   Problem  List Items Addressed This Visit    Acute cystitis with hematuria - Primary    UA/micro and story consistent with this. Given endorsed R flank discomfort (not reproducible on exam), will cover with cipro 500mg  x 7 days. Update if ongoing symptoms despite treatment. UCx sent. Pt agrees with plan.       Relevant Orders   Urine Culture   POCT Urinalysis Dipstick (Automated) (Completed)       Meds ordered this encounter  Medications  . ciprofloxacin (CIPRO) 500 MG tablet    Sig: Take 1 tablet (500 mg total) by mouth 2 (two) times daily.    Dispense:  14 tablet    Refill:  0   Orders Placed This Encounter  Procedures  . Urine Culture  . POCT Urinalysis Dipstick (Automated)    Follow up plan: Return if symptoms worsen or fail to improve.  Eustaquio BoydenJavier Deitra Craine, MD

## 2019-08-01 NOTE — Patient Instructions (Signed)
You do have UTI - treat with cipro twice daily for 1 week. Let us know if ongoing symptoms after treatment.   Urinary Tract Infection, Adult  A urinary tract infection (UTI) is an infection of any part of the urinary tract. The urinary tract includes the kidneys, ureters, bladder, and urethra. These organs make, store, and get rid of urine in the body. Your health care provider may use other names to describe the infection. An upper UTI affects the ureters and kidneys (pyelonephritis). A lower UTI affects the bladder (cystitis) and urethra (urethritis). What are the causes? Most urinary tract infections are caused by bacteria in your genital area, around the entrance to your urinary tract (urethra). These bacteria grow and cause inflammation of your urinary tract. What increases the risk? You are more likely to develop this condition if:  You have a urinary catheter that stays in place (indwelling).  You are not able to control when you urinate or have a bowel movement (you have incontinence).  You are female and you: ? Use a spermicide or diaphragm for birth control. ? Have low estrogen levels. ? Are pregnant.  You have certain genes that increase your risk (genetics).  You are sexually active.  You take antibiotic medicines.  You have a condition that causes your flow of urine to slow down, such as: ? An enlarged prostate, if you are female. ? Blockage in your urethra (stricture). ? A kidney stone. ? A nerve condition that affects your bladder control (neurogenic bladder). ? Not getting enough to drink, or not urinating often.  You have certain medical conditions, such as: ? Diabetes. ? A weak disease-fighting system (immunesystem). ? Sickle cell disease. ? Gout. ? Spinal cord injury. What are the signs or symptoms? Symptoms of this condition include:  Needing to urinate right away (urgently).  Frequent urination or passing small amounts of urine frequently.  Pain or  burning with urination.  Blood in the urine.  Urine that smells bad or unusual.  Trouble urinating.  Cloudy urine.  Vaginal discharge, if you are female.  Pain in the abdomen or the lower back. You may also have:  Vomiting or a decreased appetite.  Confusion.  Irritability or tiredness.  A fever.  Diarrhea. The first symptom in older adults may be confusion. In some cases, they may not have any symptoms until the infection has worsened. How is this diagnosed? This condition is diagnosed based on your medical history and a physical exam. You may also have other tests, including:  Urine tests.  Blood tests.  Tests for sexually transmitted infections (STIs). If you have had more than one UTI, a cystoscopy or imaging studies may be done to determine the cause of the infections. How is this treated? Treatment for this condition includes:  Antibiotic medicine.  Over-the-counter medicines to treat discomfort.  Drinking enough water to stay hydrated. If you have frequent infections or have other conditions such as a kidney stone, you may need to see a health care provider who specializes in the urinary tract (urologist). In rare cases, urinary tract infections can cause sepsis. Sepsis is a life-threatening condition that occurs when the body responds to an infection. Sepsis is treated in the hospital with IV antibiotics, fluids, and other medicines. Follow these instructions at home:  Medicines  Take over-the-counter and prescription medicines only as told by your health care provider.  If you were prescribed an antibiotic medicine, take it as told by your health care provider. Do not  stop using the antibiotic even if you start to feel better. General instructions  Make sure you: ? Empty your bladder often and completely. Do not hold urine for long periods of time. ? Empty your bladder after sex. ? Wipe from front to back after a bowel movement if you are female. Use  each tissue one time when you wipe.  Drink enough fluid to keep your urine pale yellow.  Keep all follow-up visits as told by your health care provider. This is important. Contact a health care provider if:  Your symptoms do not get better after 1-2 days.  Your symptoms go away and then return. Get help right away if you have:  Severe pain in your back or your lower abdomen.  A fever.  Nausea or vomiting. Summary  A urinary tract infection (UTI) is an infection of any part of the urinary tract, which includes the kidneys, ureters, bladder, and urethra.  Most urinary tract infections are caused by bacteria in your genital area, around the entrance to your urinary tract (urethra).  Treatment for this condition often includes antibiotic medicines.  If you were prescribed an antibiotic medicine, take it as told by your health care provider. Do not stop using the antibiotic even if you start to feel better.  Keep all follow-up visits as told by your health care provider. This is important. This information is not intended to replace advice given to you by your health care provider. Make sure you discuss any questions you have with your health care provider. Document Released: 08/23/2005 Document Revised: 10/31/2018 Document Reviewed: 05/23/2018 Elsevier Patient Education  2020 [employer]

## 2019-08-03 LAB — URINE CULTURE
MICRO NUMBER:: 849473
SPECIMEN QUALITY:: ADEQUATE

## 2019-08-18 ENCOUNTER — Telehealth: Payer: Self-pay

## 2019-08-18 DIAGNOSIS — R197 Diarrhea, unspecified: Secondary | ICD-10-CM

## 2019-08-18 NOTE — Telephone Encounter (Signed)
Pt left v/m that on 08/17/19 at 11:00 AM pt developed nausea(no vomiting) and then watery diarrhea began;pt is feeling awful; in last 24 hrs pt has had watery diarrhea at least once per hour. Pt trying to drink water,small amt of pedialyte, one cracker and banana. On 08/17/19 Made crockpot stew and put pulled rotissere chicken in the crockpot and pt ate some chicken before getting sick;pt also gave her cat a piece of chicken and cat vomited also. Pt has sharp abd pain above belly button; pain level now is 7. Pt is not urinating a lot; pt has some lightheadedness and mouth was dry during the night. Pt feels like she could be dehydrated. Pt will call husband to take pt to ED for eval and possible IV fluids. FYI to dr Darnell Level.

## 2019-08-18 NOTE — Telephone Encounter (Signed)
Noted  

## 2019-08-18 NOTE — Telephone Encounter (Signed)
Agree with this. Thank you.  plz call tomorrow for an update after ER visit.

## 2019-08-19 ENCOUNTER — Telehealth: Payer: BC Managed Care – PPO | Admitting: Physician Assistant

## 2019-08-19 DIAGNOSIS — R197 Diarrhea, unspecified: Secondary | ICD-10-CM

## 2019-08-19 NOTE — Telephone Encounter (Signed)
Pt left v/m that pt did not go to ED on 08/18/19 due to pt being concerned of going to ED due to costing $600.00 to go to ED. Pt has been drinking Pedialyte and water; pt still having some cramping but thinks she is past that. 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 Pt wants to know if Dr Darnell Level thinks pt needs covid testing due to symptoms. Pt request cb.

## 2019-08-19 NOTE — Telephone Encounter (Addendum)
Unfortunately I can't write a letter saying she has food poisoning as we don't know that for certain (most likely, but not 564% certain). That's why I recommend going ahead and having covid test done tomorrow.

## 2019-08-19 NOTE — Telephone Encounter (Addendum)
Pt called back and she did an E visit earlier and was advised due to concern pt could have C diff and should have face to face visit today. Pt thinks all this is being blown out of proportion. Pt does not think she needs to be checked for C diff; pt thinks the chicken she ate has made her sick; now pts husband is out of work; pts husband has no symptoms but pts husband cannot return to work until his wife has negative covid test. Pt has been drinking a lot of smart water & pedialyte; pt feels better today but pt just ate a banana to get food on her stomach and she has started abd cramping. Pt said she has been having stomach cramping especially after tries to eat something. Pt has had 2 watery diarrhea stools this morning. Pt said if Dr Darnell Level thinks it is necessary for pt to go for stool specimen and have a face to face visit at an UC she will go. Pt request cb.

## 2019-08-19 NOTE — Telephone Encounter (Signed)
Spoke with pt relaying Dr. Synthia Innocent message.  Pt verbalizes understanding.    As for her husband, his job is requiring a letter stating pt has food poisoning and she does not require a COVID test.  Pt asks that the letter be sent to her via [place].

## 2019-08-19 NOTE — Telephone Encounter (Signed)
Pt notified as instructed.  Given [facility] testing site info for [place]:  [place], M-F, 8-3:30, wear mask and do not get out of vehicle.  Pt verbalizes understanding.

## 2019-08-19 NOTE — Telephone Encounter (Addendum)
I agree - sounds like food poisoning as long as diarrhea, cramping is improving each day. If ongoing diarrhea, let us know for stool test.  Continue supportive care measures - small sips throughout the day, ginger ale, gatorade, chicken broth, etc.  However if husband's work requires covid testing ok to get tested - ordered. plz review testing protocol with patient.

## 2019-08-19 NOTE — Progress Notes (Signed)
We are sorry that you are not feeling well.  Here is how we plan to help!Based on what you shared with me, I feel your condition warrants further evaluation and I recommend that you be seen for a face to face office visit.  I do not think your symptoms are consistent with or secondary to COVID-19; however, I am concerned that with your recent treatment with antibiotics you may have developed a secondary intestinal infection due to C. Difficile bacteria. This can become very serious, and I would recommend following up with your PCP or going to an urgent care today to provide a stool sample for testing. You can call your PCP's office to help set this up. The antibiotic you took (cipro) has been known to cause this type of infection more than some other antibiotics. Please continue hydrating as much as possible, at least 8 glasses of water daily.   You can take 1 to 2 tablets of Tylenol (350mg -1000mg  depending on the dose) every 6 hours as needed for pain or fever.  Do not exceed 4000 mg of Tylenol daily.   For your symptoms you may take Imodium 2 mg tablets that are over the counter at your local pharmacy. Take two tablet now and then one after each loose stool up to 6 a day.   NOTE: If you entered your credit card information for this eVisit, you will not be charged. You may see a "hold" on your card for the $35 but that hold will drop off and you will not have a charge processed.  If you are having a true medical emergency please call 911.     For an urgent face to face visit, [facility] has four urgent care centers for your convenience:   . [facility]    [phone]                  Get Driving Directions  [address], [place] [address] . 10 am to 8 pm Monday-Friday . 12 pm to 8 pm Saturday-Sunday   . [facility] at [place]                  Get Driving Directions  [address], [place], [place]  [address] . 8 am to 8 pm Monday-Friday . 9 am to 6 pm Saturday . 11 am to 6 pm Sunday   . [facility] at [place]                  Get Driving Directions   [address] [place], [place] [address] . 8 am to 8 pm Monday-Friday . 8 am to 4 pm Saturday-Sunday    . [facility] at [place]                    Get Driving Directions  [phone]  [address], [place], [place] [address]  . Monday-Friday, 12 PM to 6 PM    Your e-visit answers were reviewed by a board certified advanced clinical practitioner to complete your personal care plan.  Thank you for using e-Visits.  Greater than 5 minutes, yet less than 10 minutes of time have been spent researching, coordinating, and implementing care for this patient today.

## 2019-08-19 NOTE — Addendum Note (Signed)
Addended by: Ria Bush on: 08/19/2019 03:19 PM   Modules accepted: Orders

## 2019-08-28 DIAGNOSIS — U071 COVID-19: Secondary | ICD-10-CM

## 2019-08-28 HISTORY — DX: COVID-19: U07.1

## 2019-09-05 ENCOUNTER — Other Ambulatory Visit: Payer: Self-pay

## 2019-09-05 DIAGNOSIS — Z20822 Contact with and (suspected) exposure to covid-19: Secondary | ICD-10-CM

## 2019-09-07 LAB — NOVEL CORONAVIRUS, NAA: SARS-CoV-2, NAA: DETECTED — AB

## 2019-09-24 ENCOUNTER — Telehealth: Payer: Self-pay

## 2019-09-24 NOTE — Telephone Encounter (Signed)
Left message on vm asking pt to call back.  Need to get update on how pt is doing since +COVID test.  

## 2019-09-25 NOTE — Telephone Encounter (Signed)
Left message on vm asking pt to call back.  Need to get update on how pt is doing since +COVID test.

## 2019-09-26 NOTE — Telephone Encounter (Addendum)
Spoke with pt asking how she is doing.  Says she got my message but forgot to call back.  States she is feeling much better.  All sxs have resolved except loss of sense smell/taste, but even that is improving.  Pt returned to work on 09/18/19.  Fyi to Dr. Darnell Level.

## 2019-09-28 ENCOUNTER — Encounter: Payer: Self-pay | Admitting: Family Medicine

## 2019-09-28 NOTE — Telephone Encounter (Signed)
Noted! Thank you

## 2019-10-16 ENCOUNTER — Telehealth: Payer: Self-pay

## 2019-10-16 ENCOUNTER — Encounter: Payer: Self-pay | Admitting: Family Medicine

## 2019-10-16 ENCOUNTER — Ambulatory Visit (INDEPENDENT_AMBULATORY_CARE_PROVIDER_SITE_OTHER): Payer: BC Managed Care – PPO | Admitting: Family Medicine

## 2019-10-16 VITALS — BP 144/85 | HR 71 | Temp 98.4°F | Ht 62.0 in | Wt 248.0 lb

## 2019-10-16 DIAGNOSIS — R509 Fever, unspecified: Secondary | ICD-10-CM | POA: Diagnosis not present

## 2019-10-16 NOTE — Telephone Encounter (Signed)
I spoke with pt; pt has had leg pain,fever on 10/15/19, none now but pt taking tylenol. Chills,fatigue,and h/a. Pt said health dept told pt not to be tested for covid due to antibodies in system until after Jan. Pt needs to get back to work, she is a Education officer, museum. Scheduled virtual appt today at 10 AM with Dr Diona Browner.

## 2019-10-16 NOTE — Telephone Encounter (Signed)
[facility] - Client Nonclinical Telephone Record AccessNurse Client [facility] - Client Client Site [place] Physician Ria Bush - MD Contact Type Call Who Is Calling Patient / Member / Family / Caregiver Caller Name [place] Phone Number [phone] Patient Name Gabrielle Leach Patient DOB 01/10/1986 Call Type Message Only Information Provided Reason for Call Request for General Office Information Initial Comment Caller states she had a confirmed COVID Positive in October.. Her legs are hurting and has a low fever of 99.1 and chills. Does she need a COVID test before she can come back to work? Additional Comment Please call her back. Disp. Time Disposition Final User 10/16/2019 7:29:38 AM General Information Provided Yes Artis Flock Call Closed By: Artis Flock Transaction Date/Time: 10/16/2019 7:25:06 AM (ET)

## 2019-10-16 NOTE — Progress Notes (Signed)
Chief Complaint  Patient presents with  . COVID-19 Problem    C/o COVID sxs- fever, chills, fatigue, achiness and HA.  Taking Tylenol.  Covid pos, 09/05/19.  Sxs had improved but seemed to return yesterday.     History of Present Illness: HPI  33 year old female presents with COVID19 infection.. positive test on 10/9.  Started with symptoms  Of allergies, runny nose, sneezing.. could not smell anything. Lasted 10 days.  Went back to normal self.  Yesterday she felt cold and tired. Legs achy, body aches, chills, sweating. This AM 99.9-100.1. No cough, no congestion, no ST,  fatigue off and on chronically.  No SOB, no wheeze.   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    COVID 19 screen No recent travel or known exposure to COVID19 The patient denies respiratory symptoms of COVID 19 at this time.  The importance of social distancing was discussed today.   Review of Systems  Constitutional: Positive for chills and fever.  HENT: Negative for congestion and ear pain.   Eyes: Negative for pain and redness.  Respiratory: Negative for cough and shortness of breath.   Cardiovascular: Negative for chest pain, palpitations and leg swelling.  Gastrointestinal: Negative for abdominal pain, blood in stool, constipation, diarrhea, nausea and vomiting.  Genitourinary: Negative for dysuria.  Musculoskeletal: Negative for falls and myalgias.  Skin: Negative for rash.  Neurological: Negative for dizziness.  Psychiatric/Behavioral: Negative for depression. The patient is not nervous/anxious.       Past Medical History:  Diagnosis Date  . Anxiety    situatiional due to job  . COVID-19 virus infection 08/2019  . Fatty liver   . History of chicken pox   . HLD (hyperlipidemia)    borderline  . Liver lesion   . Morbid obesity ([place])   . OSA (obstructive sleep apnea) 05/21/2013   mild, set up with CPAP ([place]) does not use c -pap due to finance  issues  . Rash    recurrent in axilla    reports that she has never smoked. She has never used smokeless tobacco. She reports current alcohol use. She reports that she does not use drugs.   Current Outpatient Medications:  .  acetaminophen (TYLENOL) 500 MG tablet, Take 1,000 mg by mouth every 6 (six) hours as needed for mild pain or fever. , Disp: , Rfl:  .  ALPRAZolam (XANAX) 0.25 MG tablet, Take 0.5-1 tablets (0.125-0.25 mg total) by mouth 2 (two) times daily as needed for anxiety., Disp: 20 tablet, Rfl: 0 .  Cholecalciferol (VITAMIN D3) 1.25 MG (50000 UT) CAPS, TAKE 1 TABLET BY MOUTH ONE TIME PER WEEK, Disp: 12 capsule, Rfl: 2 .  Cyanocobalamin (B-12) 1000 MCG SUBL, Place 1 tablet under the tongue daily., Disp: , Rfl:  .  etonogestrel-ethinyl estradiol (ELURYNG) 0.12-0.015 MG/24HR vaginal ring, Place 1 each vaginally every 28 (twenty-eight) days. Insert vaginally and leave in place for 3 consecutive weeks, then remove for 1 week., Disp: 3 each, Rfl: 3 .  ibuprofen (ADVIL,MOTRIN) 200 MG tablet, Take 400 mg by mouth every 6 (six) hours as needed for fever, headache or mild pain., Disp: , Rfl:  .  Multiple Vitamins-Minerals (MULTIVITAMIN WOMEN) TABS, Take 1 tablet by mouth daily., Disp: , Rfl:    Observations/Objective: Blood pressure (!) 144/85, pulse 71, temperature 98.4 F (36.9 C), height 5\' 2"  (1.575 m), weight 248 lb (112.5 kg), last menstrual period 09/06/2019.  Physical Exam Constitutional:      General: The patient is not in acute distress. Pulmonary:     Effort: Pulmonary effort is normal. No respiratory distress.  Neurological:     Mental Status: The patient is alert and oriented to person, place, and time.  Psychiatric:        Mood and Affect: Mood normal.        Behavior: Behavior normal.    Assessment and Plan  Fever Possible new viral infection ( flu, covid19, other) but 1 month ago with positive covid test. No  S/S of other source of infection like cellulitis, GI,  urinary.  Minimal symptoms, but low grade fever.  Symptomatic care, rest, fluids, Call if symptoms progressing.  Cannot do COVID test as possibly remain positive 90 days after initial infection.  Remain out of work/work virtually until symptoms resolved and 10 days from symptoms onset.    Kerby Nora, MD

## 2019-10-16 NOTE — Assessment & Plan Note (Signed)
Possible new viral infection ( flu, covid19, other) but 1 month ago with positive covid test. No  S/S of other source of infection like cellulitis, GI, urinary.  Minimal symptoms, but low grade fever.  Symptomatic care, rest, fluids, Call if symptoms progressing.  Cannot do COVID test as possibly remain positive 90 days after initial infection.  Remain out of work/work virtually until symptoms resolved and 10 days from symptoms onset.

## 2019-10-17 ENCOUNTER — Ambulatory Visit: Payer: BC Managed Care – PPO | Admitting: Family Medicine

## 2019-10-17 NOTE — Telephone Encounter (Signed)
Does not look like this was done yet for the patient.

## 2019-10-17 NOTE — Telephone Encounter (Signed)
I sent a message to Lattie Haw yesterday to write a  Work note for her.  If it could not be sent thorough my chart.. we need to get a fax number from the pt to fax it to her.  Can you take care of this?

## 2019-12-23 ENCOUNTER — Other Ambulatory Visit: Payer: Self-pay | Admitting: Family Medicine

## 2019-12-23 ENCOUNTER — Other Ambulatory Visit: Payer: Self-pay

## 2019-12-23 ENCOUNTER — Telehealth: Payer: Self-pay

## 2019-12-23 ENCOUNTER — Ambulatory Visit (INDEPENDENT_AMBULATORY_CARE_PROVIDER_SITE_OTHER): Payer: BC Managed Care – PPO | Admitting: Family Medicine

## 2019-12-23 ENCOUNTER — Encounter: Payer: Self-pay | Admitting: Family Medicine

## 2019-12-23 ENCOUNTER — Telehealth: Payer: BC Managed Care – PPO

## 2019-12-23 VITALS — BP 130/70 | HR 78 | Temp 98.5°F | Wt 252.0 lb

## 2019-12-23 DIAGNOSIS — K5792 Diverticulitis of intestine, part unspecified, without perforation or abscess without bleeding: Secondary | ICD-10-CM | POA: Diagnosis not present

## 2019-12-23 MED ORDER — AMOXICILLIN-POT CLAVULANATE 875-125 MG PO TABS: 1.0000 | ORAL_TABLET | Freq: Three times a day (TID) | ORAL | 0 refills | Status: AC

## 2019-12-23 NOTE — Telephone Encounter (Signed)
[facility] pharmacist advised of this and they will fill the RX that was sent in earlier today

## 2019-12-23 NOTE — Telephone Encounter (Signed)
Per off-label and guidelines dosing for treatment of Diverticulitis Augmentin every 8 hours is indicated.

## 2019-12-23 NOTE — Telephone Encounter (Signed)
Original RX was sent in with directions of 1 tablet every 8 hours for 10 days #30. Request requested by pharmacy to change to 1 tablet twice daily #20. Ok to change

## 2019-12-23 NOTE — Telephone Encounter (Signed)
Pt left v/m; has had diverticulitis 4 times previously and pt knows that is what she has. Pt has not had flair up in 4 yrs until now. Pt wants abx. Pt tried [facility] virtual visit but could not do. Pt request virtual appt to get abx for diverticulitis. Pt has constant dull LLQ pain;pain level now is 5.LLQ pain does worsen when pt stands. pts husband just got off covid quarantine 12/22/19 and pts son is at home and does not want to bring into public. Pt had normal BM this morning with no mucus or blood. No diarrhea.Pt has no covid symptoms and no travel. Dr Selena Batten agreed to see pt with limitations of exam. Pt voiced understanding and scheduled virtual appt today  at 12 noon. Pt will have vitals ready when CMA calls. UC & ED precautions given and pt voiced understanding.

## 2019-12-23 NOTE — Progress Notes (Signed)
    I connected with Gabrielle Leach on 12/23/19 at 12:00 PM EST by video and verified that I am speaking with the correct person using two identifiers.   I discussed the limitations, risks, security and privacy concerns of performing an evaluation and management service by video and the availability of in person appointments. I also discussed with the patient that there may be a patient responsible charge related to this service. The patient expressed understanding and agreed to proceed.  Patient location: Home Provider Location: [facility] Participants: Lynnda Child and Gabrielle Leach   Subjective:     Gabrielle Leach is a 34 y.o. female presenting for Abdominal Pain (x 1 day, gasey, bloated, constant pain, hx diverticulitis)     Abdominal Pain This is a new problem. The current episode started yesterday. The problem occurs constantly. The pain is located in the LLQ. The quality of the pain is cramping and sharp. The abdominal pain does not radiate. Associated symptoms include flatus. Pertinent negatives include no constipation, diarrhea, fever, hematochezia, melena, myalgias, nausea or vomiting. Nothing aggravates the pain. Relieved by: certain positions. She has tried nothing for the symptoms. Prior workup: initially had a CT scan. diverticulitis     Review of Systems  Constitutional: Negative for fever.  Gastrointestinal: Positive for abdominal pain and flatus. Negative for constipation, diarrhea, hematochezia, melena, nausea and vomiting.  Musculoskeletal: Negative for myalgias.     Social History   Tobacco Use  Smoking Status Never Smoker  Smokeless Tobacco Never Used        Objective:   BP Readings from Last 3 Encounters:  12/23/19 130/70  10/16/19 (!) 144/85  08/01/19 126/80   Wt Readings from Last 3 Encounters:  12/23/19 252 lb (114.3 kg)  10/16/19 248 lb (112.5 kg)  08/01/19 255 lb 1 oz (115.7 kg)    BP 130/70   Pulse 78   Temp 98.5 F (36.9 C)  (Oral)   Wt 252 lb (114.3 kg)   BMI 46.09 kg/m    Physical Exam Constitutional:      Appearance: Normal appearance. She is not ill-appearing.  HENT:     Head: Normocephalic and atraumatic.     Right Ear: External ear normal.     Left Ear: External ear normal.  Eyes:     Conjunctiva/sclera: Conjunctivae normal.  Pulmonary:     Effort: Pulmonary effort is normal. No respiratory distress.  Neurological:     Mental Status: She is alert. Mental status is at baseline.  Psychiatric:        Mood and Affect: Mood normal.        Behavior: Behavior normal.        Thought Content: Thought content normal.        Judgment: Judgment normal.            Assessment & Plan:   Problem List Items Addressed This Visit    None    Visit Diagnoses    Diverticulitis    -  Primary   Relevant Medications   amoxicillin-clavulanate (AUGMENTIN) 875-125 MG tablet     Third flare per patient. Mild symptoms. Will treat. Advised f/u if not improving. ER precautions discussed  Return if symptoms worsen or fail to improve.  Lynnda Child, MD

## 2019-12-23 NOTE — Telephone Encounter (Signed)
See note from today

## 2020-02-15 ENCOUNTER — Encounter: Payer: Self-pay | Admitting: Family Medicine

## 2020-02-23 NOTE — Telephone Encounter (Signed)
Pt had virtual appt on 10/16/2019.

## 2020-03-16 ENCOUNTER — Other Ambulatory Visit: Payer: Self-pay | Admitting: Family Medicine

## 2020-03-31 ENCOUNTER — Telehealth (INDEPENDENT_AMBULATORY_CARE_PROVIDER_SITE_OTHER): Payer: BC Managed Care – PPO | Admitting: Family Medicine

## 2020-03-31 ENCOUNTER — Other Ambulatory Visit: Payer: Self-pay

## 2020-03-31 ENCOUNTER — Encounter: Payer: Self-pay | Admitting: Family Medicine

## 2020-03-31 DIAGNOSIS — K5792 Diverticulitis of intestine, part unspecified, without perforation or abscess without bleeding: Secondary | ICD-10-CM | POA: Diagnosis not present

## 2020-03-31 MED ORDER — AMOXICILLIN-POT CLAVULANATE 875-125 MG PO TABS: 1.0000 | ORAL_TABLET | Freq: Two times a day (BID) | ORAL | 0 refills | Status: DC

## 2020-03-31 NOTE — Progress Notes (Signed)
Virtual Visit via Video Note  I connected with [name] on 03/31/20 at 11:30 AM EDT by a video enabled telemedicine application and verified that I am speaking with the correct person using two identifiers.  Location: Patient: In her vehicle Provider: [facility] Persons participating in virtual visit: Patient, provider, DNP student   I discussed the limitations of evaluation and management by telemedicine and the availability of in person appointments. The patient expressed understanding and agreed to proceed.  History of Present Illness: This is a 34 year old female with history of recurrent diverticulitis who requests video visit today to discuss symptoms that started last night.  She reports left lower quadrant pain that is sharp and worse with movement and walking.  She does get some relief with counterpressure.  She has had some looser bowel movements for about a month.  Her last bowel movement was this morning.  She did not see any blood or mucus.  It was loose.  She has not started any new medications.  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  She did have an lot of pistachios and peanuts yesterday and is wondering if this contributed.  She had a similar episode in January that was relieved with course of Augmentin.  She does have documented diverticulosis by CT.  She has already had her gallbladder removed.  She has never seen GI for this problem.   Observations/Objective: She is alert and answers questions appropriately.  Visible skin is unremarkable.  Respirations are even and unlabored without increased work of breathing with conversation.  No audible wheeze or witnessed cough.  Mood and affect are appropriate. There were no vitals taken for this visit. Wt Readings from Last 3 Encounters:  12/23/19 252 lb (114.3 kg)  10/16/19 248 lb (112.5 kg)  08/01/19 255 lb 1 oz (115.7 kg)    Assessment and Plan: 1. Diverticulitis -Go ahead and treat for  recurrent diverticulitis.  Since she is having increasing numbers of episodes will refer her to GI. -Return to clinic/ER precautions reviewed with patient - Ambulatory referral to Gastroenterology - amoxicillin-clavulanate (AUGMENTIN) 875-125 MG tablet; Take 1 tablet by mouth 2 (two) times daily. Take with food and full glass of water.  Dispense: 14 tablet; Refill: 0   Olean Ree, FNP-BC  [facility], [facility]  03/31/2020 11:48 AM   Follow Up Instructions:    I discussed the assessment and treatment plan with the patient. The patient was provided an opportunity to ask questions and all were answered. The patient agreed with the plan and demonstrated an understanding of the instructions.   The patient was advised to call back or seek an in-person evaluation if the symptoms worsen or if the condition fails to improve as anticipated.   Emi Belfast, FNP

## 2020-05-11 ENCOUNTER — Other Ambulatory Visit: Payer: Self-pay

## 2020-05-11 ENCOUNTER — Ambulatory Visit (INDEPENDENT_AMBULATORY_CARE_PROVIDER_SITE_OTHER): Payer: BC Managed Care – PPO | Admitting: Gastroenterology

## 2020-05-11 ENCOUNTER — Encounter: Payer: Self-pay | Admitting: Gastroenterology

## 2020-05-11 VITALS — BP 129/83 | HR 86 | Temp 98.5°F | Ht 62.0 in | Wt 238.0 lb

## 2020-05-11 DIAGNOSIS — K5732 Diverticulitis of large intestine without perforation or abscess without bleeding: Secondary | ICD-10-CM | POA: Diagnosis not present

## 2020-05-11 MED ORDER — CLENPIQ 10-3.5-12 MG-GM -GM/160ML PO SOLN: 320.0000 mL | Freq: Once | ORAL | 0 refills | Status: AC

## 2020-05-11 NOTE — Patient Instructions (Signed)

## 2020-05-11 NOTE — Progress Notes (Signed)
Cephas Darby, MD [address]  [place]  [place], [place] [address]  Main: [phone]  Fax: [phone]    Gastroenterology Consultation  Referring Provider:     Elby Beck, FNP Primary Care Physician:  Ria Bush, MD Primary Gastroenterologist:  Dr. Cephas Darby Reason for Consultation:     Recurrent diverticulitis        HPI:   Gabrielle Leach is a 34 y.o. female referred by Dr. Ria Bush, MD  for consultation & management of recurrent diverticulitis patient reports that for last 5 years, she has been experiencing recurrent episodes of left lower quadrant pain associated with loose bowel movements, nonbloody.  Her initial episode was in 2016, confirmed on the CT scan, treated with antibiotics.  She had another episode a year later.  She had 2 more episodes within last 6 months, 1 in 11/2019 and other episodes in 03/2020.  She is treated with Augmentin.  Patient reports that her pain responds well to Augmentin.  Patient reports that she lost about 30 pounds by following new prepandemic.  During pandemic, with her profession being a Museum/gallery conservator, she regained her weight back.  She acknowledges that her diet is not balanced.  She does consume red meat about once a week.  She denies drinking sodas.  She is here to discuss about colonoscopy Today, she is asymptomatic She does not smoke or drink alcohol  NSAIDs: None  Antiplts/Anticoagulants/Anti thrombotics: None  GI Procedures: None She denies family history of GI malignancy  Past Medical History:  Diagnosis Date  . Anxiety    situatiional due to job  . COVID-19 virus infection 08/2019  . Fatty liver   . History of chicken pox   . HLD (hyperlipidemia)    borderline  . Liver lesion   . Morbid obesity ([facility])   . OSA (obstructive sleep apnea) 05/21/2013   mild, set up with CPAP ([facility]) does not use c -pap due to finance issues  . Rash    recurrent in axilla    Past Surgical  History:  Procedure Laterality Date  . CHOLECYSTECTOMY N/A 10/21/2013   chronic cholecystitis; Procedure: LAPAROSCOPIC CHOLECYSTECTOMY WITH INTRAOPERATIVE CHOLANGIOGRAM;  Surgeon: Adin Hector, MD  . WISDOM TOOTH EXTRACTION      Current Outpatient Medications:  .  acetaminophen (TYLENOL) 500 MG tablet, Take 1,000 mg by mouth every 6 (six) hours as needed for mild pain or fever. , Disp: , Rfl:  .  ALPRAZolam (XANAX) 0.25 MG tablet, Take 0.5-1 tablets (0.125-0.25 mg total) by mouth 2 (two) times daily as needed for anxiety., Disp: 20 tablet, Rfl: 0 .  Cholecalciferol (D3-50) 1.25 MG (50000 UT) capsule, TAKE 1 TABLET BY MOUTH ONE TIME PER WEEK, Disp: 12 capsule, Rfl: 1 .  Cyanocobalamin (B-12) 1000 MCG SUBL, Place 1 tablet under the tongue daily., Disp: , Rfl:  .  etonogestrel-ethinyl estradiol (ELURYNG) 0.12-0.015 MG/24HR vaginal ring, Place 1 each vaginally every 28 (twenty-eight) days. Insert vaginally and leave in place for 3 consecutive weeks, then remove for 1 week., Disp: 3 each, Rfl: 3 .  ibuprofen (ADVIL,MOTRIN) 200 MG tablet, Take 400 mg by mouth every 6 (six) hours as needed for fever, headache or mild pain., Disp: , Rfl:  .  Sod Picosulfate-Mag Ox-Cit Acd (CLENPIQ) 10-3.5-12 MG-GM -GM/160ML SOLN, Take 320 mLs by mouth once for 1 dose., Disp: 320 mL, Rfl: 0   Family History  Problem Relation Age of Onset  . Diabetes Father  borderline  . Heart disease Mother        "hardening of arteries"  . Rheum arthritis Mother   . CAD Paternal Grandmother 38  . CAD Paternal Grandfather 49  . Parkinson's disease Paternal Grandfather   . CAD Maternal Grandmother 45       thinks heart related  . Diabetes Maternal Grandmother   . Diabetes Maternal Uncle   . Cancer Other        breast - great grandmother  . Cancer Other        liver - great grandfather  . Hypertension Paternal Aunt   . Cancer Cousin 40       breast  . Rheum arthritis Maternal Aunt   . Rheum arthritis Cousin       Social History   Tobacco Use  . Smoking status: Never Smoker  . Smokeless tobacco: Never Used  Substance Use Topics  . Alcohol use: Yes    Alcohol/week: 6.0 standard drinks    Types: 6 Standard drinks or equivalent per week    Comment: occ  . Drug use: No    Allergies as of 05/11/2020  . (No Known Allergies)    Review of Systems:    All systems reviewed and negative except where noted in HPI.   Physical Exam:  BP 129/83 (BP Location: Left Arm, Patient Position: Sitting, Cuff Size: Normal)   Pulse 86   Temp 98.5 F (36.9 C) (Oral)   Ht 5\' 2"  (1.575 m)   Wt 238 lb (108 kg)   BMI 43.53 kg/m  No LMP recorded.  General:   Alert,  Well-developed, well-nourished, pleasant and cooperative in NAD Head:  Normocephalic and atraumatic. Eyes:  Sclera clear, no icterus.   Conjunctiva pink. Ears:  Normal auditory acuity. Nose:  No deformity, discharge, or lesions. Mouth:  No deformity or lesions,oropharynx pink & moist. Neck:  Supple; no masses or thyromegaly. Lungs:  Respirations even and unlabored.  Clear throughout to auscultation.   No wheezes, crackles, or rhonchi. No acute distress. Heart:  Regular rate and rhythm; no murmurs, clicks, rubs, or gallops. Abdomen:  Normal bowel sounds. Soft, obese, non-tender and non-distended without masses, hepatosplenomegaly or hernias noted.  No guarding or rebound tenderness.   Rectal: Not performed Msk:  Symmetrical without gross deformities. Good, equal movement & strength bilaterally. Pulses:  Normal pulses noted. Extremities:  No clubbing or edema.  No cyanosis. Neurologic:  Alert and oriented x3;  grossly normal neurologically. Skin:  Intact without significant lesions or rashes. No jaundice. Psych:  Alert and cooperative. Normal mood and affect.  Imaging Studies: Reviewed  Assessment and Plan:   Gabrielle Leach is a 34 y.o. female with morbid obesity, BMI 43, fatty liver is seen in consultation for recurrent episodes of acute  uncomplicated left-sided diverticulitis, responds well to short course of antibiotics  Discussed about lifestyle modification, high-fiber diet, exercise, weight loss, avoiding processed foods Recommend diagnostic colonoscopy to rule out any malignancy although less likely  B12 deficiency Continue to take B12 supplement   Follow up based on colonoscopy results   31, MD

## 2020-06-15 ENCOUNTER — Other Ambulatory Visit: Payer: Self-pay

## 2020-06-15 ENCOUNTER — Other Ambulatory Visit
Admission: RE | Admit: 2020-06-15 | Discharge: 2020-06-15 | Disposition: A | Discharge status: Home / Self Care | Payer: BC Managed Care – PPO | Source: Ambulatory Visit | Attending: Gastroenterology | Admitting: Gastroenterology

## 2020-06-15 DIAGNOSIS — Z01812 Encounter for preprocedural laboratory examination: Secondary | ICD-10-CM | POA: Diagnosis present

## 2020-06-15 DIAGNOSIS — Z20822 Contact with and (suspected) exposure to covid-19: Secondary | ICD-10-CM | POA: Insufficient documentation

## 2020-06-15 LAB — SARS CORONAVIRUS 2 (TAT 6-24 HRS): SARS Coronavirus 2: NEGATIVE

## 2020-06-16 ENCOUNTER — Other Ambulatory Visit: Payer: Self-pay | Admitting: Family Medicine

## 2020-06-16 DIAGNOSIS — Z3009 Encounter for other general counseling and advice on contraception: Secondary | ICD-10-CM

## 2020-06-16 NOTE — Telephone Encounter (Signed)
Plz schedule lab cpe and lab visits.  Then return encounter to me for refill.

## 2020-06-17 ENCOUNTER — Encounter: Payer: Self-pay | Admitting: Gastroenterology

## 2020-06-17 ENCOUNTER — Ambulatory Visit: Payer: BC Managed Care – PPO | Admitting: Certified Registered Nurse Anesthetist

## 2020-06-17 ENCOUNTER — Other Ambulatory Visit: Payer: Self-pay

## 2020-06-17 ENCOUNTER — Encounter
Admission: RE | Disposition: A | Discharge status: Home / Self Care | Payer: Self-pay | Source: Home / Self Care | Attending: Gastroenterology

## 2020-06-17 ENCOUNTER — Ambulatory Visit
Admission: RE | Admit: 2020-06-17 | Discharge: 2020-06-17 | Disposition: A | Discharge status: Home / Self Care | Payer: BC Managed Care – PPO | Attending: Gastroenterology | Admitting: Gastroenterology

## 2020-06-17 DIAGNOSIS — Z79899 Other long term (current) drug therapy: Secondary | ICD-10-CM | POA: Diagnosis not present

## 2020-06-17 DIAGNOSIS — F419 Anxiety disorder, unspecified: Secondary | ICD-10-CM | POA: Diagnosis not present

## 2020-06-17 DIAGNOSIS — G4733 Obstructive sleep apnea (adult) (pediatric): Secondary | ICD-10-CM | POA: Insufficient documentation

## 2020-06-17 DIAGNOSIS — Z8719 Personal history of other diseases of the digestive system: Secondary | ICD-10-CM | POA: Diagnosis present

## 2020-06-17 DIAGNOSIS — K5790 Diverticulosis of intestine, part unspecified, without perforation or abscess without bleeding: Secondary | ICD-10-CM | POA: Insufficient documentation

## 2020-06-17 DIAGNOSIS — Z8616 Personal history of COVID-19: Secondary | ICD-10-CM | POA: Insufficient documentation

## 2020-06-17 DIAGNOSIS — K5732 Diverticulitis of large intestine without perforation or abscess without bleeding: Secondary | ICD-10-CM

## 2020-06-17 DIAGNOSIS — Z6841 Body Mass Index (BMI) 40.0 and over, adult: Secondary | ICD-10-CM | POA: Insufficient documentation

## 2020-06-17 DIAGNOSIS — K760 Fatty (change of) liver, not elsewhere classified: Secondary | ICD-10-CM | POA: Insufficient documentation

## 2020-06-17 DIAGNOSIS — K5730 Diverticulosis of large intestine without perforation or abscess without bleeding: Secondary | ICD-10-CM | POA: Diagnosis not present

## 2020-06-17 HISTORY — PX: COLONOSCOPY WITH PROPOFOL: SHX5780

## 2020-06-17 LAB — POCT PREGNANCY, URINE: Preg Test, Ur: NEGATIVE

## 2020-06-17 SURGERY — COLONOSCOPY WITH PROPOFOL
Anesthesia: General

## 2020-06-17 MED ORDER — PROPOFOL 10 MG/ML IV BOLUS
INTRAVENOUS | Status: AC
  Filled 2020-06-17: qty 20

## 2020-06-17 MED ORDER — SODIUM CHLORIDE 0.9 % IV SOLN: INTRAVENOUS | Status: DC

## 2020-06-17 MED ORDER — PROPOFOL 500 MG/50ML IV EMUL
INTRAVENOUS | Status: DC | PRN
  Administered 2020-06-17: 125 ug/kg/min via INTRAVENOUS

## 2020-06-17 MED ORDER — PROPOFOL 10 MG/ML IV BOLUS
INTRAVENOUS | Status: DC | PRN
  Administered 2020-06-17: 30 mg via INTRAVENOUS

## 2020-06-17 MED ORDER — LIDOCAINE HCL (CARDIAC) PF 100 MG/5ML IV SOSY
PREFILLED_SYRINGE | INTRAVENOUS | Status: DC | PRN
  Administered 2020-06-17: 50 mg via INTRAVENOUS

## 2020-06-17 MED ORDER — PROPOFOL 500 MG/50ML IV EMUL
INTRAVENOUS | Status: AC
  Filled 2020-06-17: qty 50

## 2020-06-17 MED ORDER — LIDOCAINE HCL (PF) 1 % IJ SOLN
INTRAMUSCULAR | Status: AC
  Filled 2020-06-17: qty 2

## 2020-06-17 NOTE — Transfer of Care (Signed)
Immediate Anesthesia Transfer of Care Note  Patient: Gabrielle Leach  Procedure(s) Performed: COLONOSCOPY WITH PROPOFOL (N/A )  Patient Location: PACU  Anesthesia Type:General  Level of Consciousness: awake, alert  and oriented  Airway & Oxygen Therapy: Patient Spontanous Breathing  Post-op Assessment: Report given to RN and Post -op Vital signs reviewed and stable  Post vital signs: Reviewed and stable  Last Vitals:  Vitals Value Taken Time  BP    Temp    Pulse 83 06/17/20 0947  Resp 19 06/17/20 0947  SpO2 99 % 06/17/20 0947  Vitals shown include unvalidated device data.  Last Pain:  Vitals:   06/17/20 0833  TempSrc: Temporal         Complications: No complications documented.

## 2020-06-17 NOTE — Telephone Encounter (Signed)
Called patient and left voicemail for patient to call office and schedule cpe and labs 

## 2020-06-17 NOTE — H&P (Signed)
Arlyss Repress, MD [address]  [address]  [place], [place] [address]  Main: [phone]  Fax: [phone] Pager: [phone]  Primary Care Physician:  Eustaquio Boyden, MD Primary Gastroenterologist:  Dr. Arlyss Repress  Pre-Procedure History & Physical: HPI:  Gabrielle Leach is a 34 y.o. female is here for an colonoscopy.   Past Medical History:  Diagnosis Date  . Anxiety    situatiional due to job  . COVID-19 virus infection 08/2019  . Fatty liver   . History of chicken pox   . HLD (hyperlipidemia)    borderline  . Liver lesion   . Morbid obesity ([facility])   . OSA (obstructive sleep apnea) 05/21/2013   mild, set up with CPAP ([facility]) does not use c -pap due to finance issues  . Rash    recurrent in axilla    Past Surgical History:  Procedure Laterality Date  . CHOLECYSTECTOMY N/A 10/21/2013   chronic cholecystitis; Procedure: LAPAROSCOPIC CHOLECYSTECTOMY WITH INTRAOPERATIVE CHOLANGIOGRAM;  Surgeon: Ernestene Mention, MD  . WISDOM TOOTH EXTRACTION      Prior to Admission medications   Medication Sig Start Date End Date Taking? Authorizing Provider  ALPRAZolam (XANAX) 0.25 MG tablet Take 0.5-1 tablets (0.125-0.25 mg total) by mouth 2 (two) times daily as needed for anxiety. 06/17/19  Yes Eustaquio Boyden, MD  Cholecalciferol (D3-50) 1.25 MG (50000 UT) capsule TAKE 1 TABLET BY MOUTH ONE TIME PER WEEK 03/17/20  Yes Eustaquio Boyden, MD  Cyanocobalamin (B-12) 1000 MCG SUBL Place 1 tablet under the tongue daily. 06/17/19  Yes Eustaquio Boyden, MD  ibuprofen (ADVIL,MOTRIN) 200 MG tablet Take 400 mg by mouth every 6 (six) hours as needed for fever, headache or mild pain.   Yes [provider]  acetaminophen (TYLENOL) 500 MG tablet Take 1,000 mg by mouth every 6 (six) hours as needed for mild pain or fever.     [provider]  etonogestrel-ethinyl estradiol Eulah Citizen) 0.12-0.015 MG/24HR vaginal ring Place 1 each vaginally every 28 (twenty-eight) days. Insert  vaginally and leave in place for 3 consecutive weeks, then remove for 1 week. 06/17/19   Eustaquio Boyden, MD    Allergies as of 05/11/2020  . (No Known Allergies)    Family History  Problem Relation Age of Onset  . Diabetes Father        borderline  . Heart disease Mother        "hardening of arteries"  . Rheum arthritis Mother   . CAD Paternal Grandmother 79  . CAD Paternal Grandfather 79  . Parkinson's disease Paternal Grandfather   . CAD Maternal Grandmother 45       thinks heart related  . Diabetes Maternal Grandmother   . Diabetes Maternal Uncle   . Cancer Other        breast - great grandmother  . Cancer Other        liver - great grandfather  . Hypertension Paternal Aunt   . Cancer Cousin 40       breast  . Rheum arthritis Maternal Aunt   . Rheum arthritis Cousin     Social History   Socioeconomic History  . Marital status: Married    Spouse name: Not on file  . Number of children: Not on file  . Years of education: Not on file  . Highest education level: Not on file  Occupational History  . Not on file  Tobacco Use  . Smoking status: Never Smoker  . Smokeless tobacco: Never Used  Vaping Use  . Vaping Use: Never used  Substance and Sexual Activity  . Alcohol use: Yes    Alcohol/week: 6.0 standard drinks    Types: 6 Standard drinks or equivalent per week    Comment: occ  . Drug use: No  . Sexual activity: Not on file  Other Topics Concern  . Not on file  Social History Narrative   Lives with husband and son, 1 cat   Occupation: Forensic psychologist , 2nd grade teacher   Edu: BS   Activity: no regular exercise   Diet: good water, fruits/vegetables daily   Social Determinants of Health   Financial Resource Strain:   . Difficulty of Paying Living Expenses:   Food Insecurity:   . Worried About Programme researcher, broadcasting/film/video in the Last Year:   . Barista in the Last Year:   Transportation Needs:   . Freight forwarder (Medical):   Marland Kitchen Lack of  Transportation (Non-Medical):   Physical Activity:   . Days of Exercise per Week:   . Minutes of Exercise per Session:   Stress:   . Feeling of Stress :   Social Connections:   . Frequency of Communication with Friends and Family:   . Frequency of Social Gatherings with Friends and Family:   . Attends Religious Services:   . Active Member of Clubs or Organizations:   . Attends Banker Meetings:   Marland Kitchen Marital Status:   Intimate Partner Violence:   . Fear of Current or Ex-Partner:   . Emotionally Abused:   Marland Kitchen Physically Abused:   . Sexually Abused:     Review of Systems: See HPI, otherwise negative ROS  Physical Exam: BP 121/85   Pulse 94   Temp (!) 97.1 F (36.2 C) (Temporal)   Resp 18   Ht 5\' 3"  (1.6 m)   Wt 113.4 kg   LMP 06/17/2020   SpO2 99%   BMI 44.29 kg/m  General:   Alert,  pleasant and cooperative in NAD Head:  Normocephalic and atraumatic. Neck:  Supple; no masses or thyromegaly. Lungs:  Clear throughout to auscultation.    Heart:  Regular rate and rhythm. Abdomen:  Soft, nontender and nondistended. Normal bowel sounds, without guarding, and without rebound.   Neurologic:  Alert and  oriented x4;  grossly normal neurologically.  Impression/Plan: Gabrielle Leach is here for an colonoscopy to be performed for recurrent diverticulitis  Risks, benefits, limitations, and alternatives regarding  colonoscopy have been reviewed with the patient.  Questions have been answered.  All parties agreeable.   [name], MD  06/17/2020, 8:52 AM

## 2020-06-17 NOTE — Anesthesia Preprocedure Evaluation (Signed)
Anesthesia Evaluation  Patient identified by MRN, date of birth, ID band Patient awake    Reviewed: Allergy & Precautions, NPO status , Patient's Chart, lab work & pertinent test results  History of Anesthesia Complications Negative for: history of anesthetic complications  Airway Mallampati: III  TM Distance: >3 FB Neck ROM: Full    Dental no notable dental hx. (+) Teeth Intact   Pulmonary sleep apnea , neg COPD, Patient abstained from smoking.Not current smoker,  Mild OSA per 2014 sleep study, never used CPAP    + decreased breath sounds      Cardiovascular Exercise Tolerance: Good METS(-) hypertension(-) CAD and (-) Past MI negative cardio ROS  (-) dysrhythmias  Rhythm:Regular Rate:Normal - Systolic murmurs    Neuro/Psych PSYCHIATRIC DISORDERS Anxiety negative neurological ROS     GI/Hepatic neg GERD  ,(+)     (-) substance abuse  ,   Endo/Other  neg diabetesMorbid obesity  Renal/GU negative Renal ROS     Musculoskeletal   Abdominal (+) + obese,   Peds  Hematology   Anesthesia Other Findings Past Medical History: No date: Anxiety     Comment:  situatiional due to job 08/2019: COVID-19 virus infection No date: Fatty liver No date: History of chicken pox No date: HLD (hyperlipidemia)     Comment:  borderline No date: Liver lesion No date: Morbid obesity ([facility]) 05/21/2013: OSA (obstructive sleep apnea)     Comment:  mild, set up with CPAP ([facility]) does not use c -pap due to              finance issues No date: Rash     Comment:  recurrent in axilla  Reproductive/Obstetrics                             Anesthesia Physical Anesthesia Plan  ASA: III  Anesthesia Plan: General   Post-op Pain Management:    Induction: Intravenous  PONV Risk Score and Plan: 3 and Ondansetron, Propofol infusion and TIVA  Airway Management Planned: Nasal Cannula  Additional Equipment:  None  Intra-op Plan:   Post-operative Plan:   Informed Consent: I have reviewed the patients History and Physical, chart, labs and discussed the procedure including the risks, benefits and alternatives for the proposed anesthesia with the patient or authorized representative who has indicated his/her understanding and acceptance.     Dental advisory given  Plan Discussed with: CRNA and Surgeon  Anesthesia Plan Comments: (Discussed risks of anesthesia with patient, including possibility of difficulty with spontaneous ventilation under anesthesia necessitating airway intervention, PONV, and rare risks such as cardiac or respiratory or neurological events. Patient understands. Patient informed about increased incidence of above perioperative risk due to high BMI. Patient understands. )        Anesthesia Quick Evaluation

## 2020-06-17 NOTE — Op Note (Signed)
[facility] Patient Name: Gabrielle Leach Procedure Date: 06/17/2020 9:25 AM MRN: [id number] Account #: [id number] Date of Birth: 10-29-1986 Admit Type: Outpatient Age: 34 Room: [facility] ENDO ROOM 4 Gender: Female Note Status: Finalized Procedure:             Colonoscopy Indications:           This is the patient's first colonoscopy, Follow-up of                         diverticulitis Providers:             Lin Landsman MD, MD Referring MD:          Ria Bush (Referring MD) Medicines:             Monitored Anesthesia Care Complications:         No immediate complications. Estimated blood loss: None. Procedure:             Pre-Anesthesia Assessment:                        - Prior to the procedure, a History and Physical was                         performed, and patient medications and allergies were                         reviewed. The patient is competent. The risks and                         benefits of the procedure and the sedation options and                         risks were discussed with the patient. All questions                         were answered and informed consent was obtained.                         Patient identification and proposed procedure were                         verified by the physician, the nurse, the                         anesthesiologist, the anesthetist and the technician                         in the pre-procedure area in the procedure room in the                         endoscopy suite. Mental Status Examination: alert and                         oriented. Airway Examination: normal oropharyngeal                         airway and neck mobility. Respiratory Examination:  clear to auscultation. CV Examination: normal.                         Prophylactic Antibiotics: The patient does not require                         prophylactic antibiotics. Prior Anticoagulants: The                          patient has taken no previous anticoagulant or                         antiplatelet agents. ASA Grade Assessment: III - A                         patient with severe systemic disease. After reviewing                         the risks and benefits, the patient was deemed in                         satisfactory condition to undergo the procedure. The                         anesthesia plan was to use monitored anesthesia care                         (MAC). Immediately prior to administration of                         medications, the patient was re-assessed for adequacy                         to receive sedatives. The heart rate, respiratory                         rate, oxygen saturations, blood pressure, adequacy of                         pulmonary ventilation, and response to care were                         monitored throughout the procedure. The physical                         status of the patient was re-assessed after the                         procedure.                        After obtaining informed consent, the colonoscope was                         passed under direct vision. Throughout the procedure,                         the patient's blood pressure, pulse, and oxygen  saturations were monitored continuously. The                         Colonoscope was introduced through the anus and                         advanced to the the terminal ileum, with                         identification of the appendiceal orifice and IC                         valve. The colonoscopy was performed without                         difficulty. The patient tolerated the procedure well.                         The quality of the bowel preparation was evaluated                         using the BBPS [facility] Bowel Preparation Scale) with                         scores of: Right Colon = 3, Transverse Colon = 3 and                         Left Colon = 3  (entire mucosa seen well with no                         residual staining, small fragments of stool or opaque                         liquid). The total BBPS score equals 9. Findings:      The perianal and digital rectal examinations were normal. Pertinent       negatives include normal sphincter tone and no palpable rectal lesions.      The terminal ileum appeared normal.      Multiple diverticula were found in the entire colon.      Normal mucosa was found in the entire colon.      The retroflexed view of the distal rectum and anal verge was normal and       showed no anal or rectal abnormalities. Impression:            - The examined portion of the ileum was normal.                        - Diverticulosis in the entire examined colon.                        - Normal mucosa in the entire examined colon.                        - The distal rectum and anal verge are normal on                         retroflexion view.                        -  No specimens collected. Recommendation:        - Discharge patient to home (with escort).                        - Resume previous diet today.                        - Continue present medications. Procedure Code(s):     --- Professional ---                        [phone], Colonoscopy, flexible; diagnostic, including                         collection of specimen(s) by brushing or washing, when                         performed (separate procedure) Diagnosis Code(s):     --- Professional ---                        I26.41, Diverticulitis of large intestine without                         perforation or abscess without bleeding                        K57.30, Diverticulosis of large intestine without                         perforation or abscess without bleeding CPT copyright 2019 American Medical Association. All rights reserved. The codes documented in this report are preliminary and upon coder review may  be revised to meet current compliance  requirements. Dr. Ulyess Mort Lin Landsman MD, MD 06/17/2020 9:45:01 AM This report has been signed electronically. Number of Addenda: 0 Note Initiated On: 06/17/2020 9:25 AM Scope Withdrawal Time: 0 hours 4 minutes 13 seconds  Total Procedure Duration: 0 hours 7 minutes 0 seconds  Estimated Blood Loss:  Estimated blood loss: none.      [facility]

## 2020-06-17 NOTE — Anesthesia Postprocedure Evaluation (Signed)
Anesthesia Post Note  Patient: Gabrielle Leach  Procedure(s) Performed: COLONOSCOPY WITH PROPOFOL (N/A )  Patient location during evaluation: Endoscopy Anesthesia Type: General Level of consciousness: awake and alert Pain management: pain level controlled Vital Signs Assessment: post-procedure vital signs reviewed and stable Respiratory status: spontaneous breathing, nonlabored ventilation, respiratory function stable and patient connected to nasal cannula oxygen Cardiovascular status: blood pressure returned to baseline and stable Postop Assessment: no apparent nausea or vomiting Anesthetic complications: no   No complications documented.   Last Vitals:  Vitals:   06/17/20 1000 06/17/20 1010  BP: 123/86 123/86  Pulse: 74 75  Resp: (!) 25 20  Temp:    SpO2: 98% 100%    Last Pain:  Vitals:   06/17/20 0833  TempSrc: Temporal                 Corinda Gubler

## 2020-06-18 ENCOUNTER — Encounter: Payer: Self-pay | Admitting: Gastroenterology

## 2021-10-12 ENCOUNTER — Encounter: Payer: Self-pay | Admitting: Family Medicine

## 2021-10-12 ENCOUNTER — Telehealth (INDEPENDENT_AMBULATORY_CARE_PROVIDER_SITE_OTHER): Payer: BC Managed Care – PPO | Admitting: Family Medicine

## 2021-10-12 ENCOUNTER — Other Ambulatory Visit: Payer: Self-pay

## 2021-10-12 VITALS — BP 140/103 | HR 92 | Temp 98.0°F | Ht 63.0 in | Wt 263.0 lb

## 2021-10-12 DIAGNOSIS — J0110 Acute frontal sinusitis, unspecified: Secondary | ICD-10-CM

## 2021-10-12 DIAGNOSIS — J019 Acute sinusitis, unspecified: Secondary | ICD-10-CM | POA: Insufficient documentation

## 2021-10-12 DIAGNOSIS — R030 Elevated blood-pressure reading, without diagnosis of hypertension: Secondary | ICD-10-CM | POA: Insufficient documentation

## 2021-10-12 MED ORDER — AMOXICILLIN-POT CLAVULANATE 875-125 MG PO TABS
1.0000 | ORAL_TABLET | Freq: Two times a day (BID) | ORAL | 0 refills | Status: AC
Start: 2021-10-12 — End: 2021-10-22

## 2021-10-12 NOTE — Assessment & Plan Note (Addendum)
Anticipate acute bacterial sinusitis given duration and progression of symptoms. Supportive care reviewed. Start augmentin 10d course. Update if not improving with treatment.

## 2021-10-12 NOTE — Assessment & Plan Note (Signed)
No prior h/o htn. ?due to acute illness. No known recent decongestant use.  I have asked her to monitor closely at home once sinusitis symptoms have resolved, and if BP staying >140/90 schedule OV for further evaluation - she agrees.

## 2021-10-12 NOTE — Progress Notes (Signed)
Patient ID: Gabrielle Leach, female    DOB: 1986-07-26, 35 y.o.   MRN: [id number]  Virtual visit completed through MyChart, a video enabled telemedicine application. Due to national recommendations of social distancing due to COVID-19, a virtual visit is felt to be most appropriate for this patient at this time. Reviewed limitations, risks, security and privacy concerns of performing a virtual visit and the availability of in person appointments. I also reviewed that there may be a patient responsible charge related to this service. The patient agreed to proceed.   Patient location: home Provider location: [place] [facility], office Persons participating in this virtual visit: patient, provider   If any vitals were documented, they were collected by patient at home unless specified below.    BP (!) 140/103   Pulse 92   Temp 98 F (36.7 C)   Ht 5\' 3"  (1.6 m)   Wt 263 lb (119.3 kg)   LMP 09/05/2021   BMI 46.59 kg/m    CC: sinus problems Subjective:   HPI: Gabrielle Leach is a 35 y.o. female presenting on 10/12/2021 for Sinus Problem (C/o sinus pressure, HA, facial pain around eyes/forehead, cough, nasal drainage- yellow and fatigue.  Sxs started 09/26/21, worsened in past week.  Neg home COVID test about 4 days ago. )   2+ wk h/o malaise, fatigue, worsening head congestion, forehead sinus pressure, coughing up yellow mucous. Scratchy throat and hoarse voice. Ears feel clogged.  No fevers/chills, chest congestion, ear or tooth pain, abd pain, nausea, diarrhea.  Treating with tylenol, plain mucinex.   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 Recent flu shot and COVID booster.   No h/o asthma or allergies. Non smoker.  BP elevated today - attributed to acute illness.      Relevant past medical, surgical, family and social history reviewed and updated as indicated. Interim medical history since our last visit  reviewed. Allergies and medications reviewed and updated. Outpatient Medications Prior to Visit  Medication Sig Dispense Refill   acetaminophen (TYLENOL) 500 MG tablet Take 1,000 mg by mouth every 6 (six) hours as needed for mild pain or fever.      ALPRAZolam (XANAX) 0.25 MG tablet Take 0.5-1 tablets (0.125-0.25 mg total) by mouth 2 (two) times daily as needed for anxiety. 20 tablet 0   Cholecalciferol (D3-50) 1.25 MG (50000 UT) capsule TAKE 1 TABLET BY MOUTH ONE TIME PER WEEK 12 capsule 1   Cyanocobalamin (B-12) 1000 MCG SUBL Place 1 tablet under the tongue daily.     etonogestrel-ethinyl estradiol (ELURYNG) 0.12-0.015 MG/24HR vaginal ring Place 1 each vaginally every 28 (twenty-eight) days. Insert vaginally and leave in place for 3 consecutive weeks, then remove for 1 week. 3 each 3   ibuprofen (ADVIL,MOTRIN) 200 MG tablet Take 400 mg by mouth every 6 (six) hours as needed for fever, headache or mild pain.     No facility-administered medications prior to visit.     Per HPI unless specifically indicated in ROS section below Review of Systems Objective:  BP (!) 140/103   Pulse 92   Temp 98 F (36.7 C)   Ht 5\' 3"  (1.6 m)   Wt 263 lb (119.3 kg)   LMP 09/05/2021   BMI 46.59 kg/m   Wt Readings from Last 3 Encounters:  10/12/21 263 lb (119.3 kg)  06/17/20 250 lb (113.4 kg)  05/11/20 238 lb (108 kg)  Physical exam: Gen: alert, NAD, not ill appearing Pulm: speaks in complete sentences without increased work of breathing Psych: normal mood, normal thought content      Results for orders placed or performed during the hospital encounter of 06/17/20  Pregnancy, urine POC  Result Value Ref Range   Preg Test, Ur NEGATIVE NEGATIVE   Assessment & Plan:   Problem List Items Addressed This Visit     Acute sinusitis - Primary    Anticipate acute bacterial sinusitis given duration and progression of symptoms. Supportive care reviewed. Start augmentin 10d course. Update if not  improving with treatment.       Relevant Medications   amoxicillin-clavulanate (AUGMENTIN) 875-125 MG tablet   Elevated blood pressure reading without diagnosis of hypertension    No prior h/o htn. ?due to acute illness. No known recent decongestant use.  I have asked her to monitor closely at home once sinusitis symptoms have resolved, and if BP staying >140/90 schedule OV for further evaluation - she agrees.        Meds ordered this encounter  Medications   amoxicillin-clavulanate (AUGMENTIN) 875-125 MG tablet    Sig: Take 1 tablet by mouth 2 (two) times daily for 10 days.    Dispense:  20 tablet    Refill:  0    No orders of the defined types were placed in this encounter.   I discussed the assessment and treatment plan with the patient. The patient was provided an opportunity to ask questions and all were answered. The patient agreed with the plan and demonstrated an understanding of the instructions. The patient was advised to call back or seek an in-person evaluation if the symptoms worsen or if the condition fails to improve as anticipated.  Follow up plan: No follow-ups on file.  Gabrielle Boyden, MD

## 2021-12-23 ENCOUNTER — Telehealth: Payer: BC Managed Care – PPO | Admitting: Nurse Practitioner

## 2021-12-23 ENCOUNTER — Other Ambulatory Visit: Payer: Self-pay

## 2021-12-23 ENCOUNTER — Telehealth: Payer: Self-pay | Admitting: *Deleted

## 2021-12-23 ENCOUNTER — Ambulatory Visit
Admission: EM | Admit: 2021-12-23 | Discharge: 2021-12-23 | Disposition: A | Discharge status: Home / Self Care | Payer: BC Managed Care – PPO

## 2021-12-23 ENCOUNTER — Encounter: Payer: Self-pay | Admitting: Emergency Medicine

## 2021-12-23 DIAGNOSIS — R509 Fever, unspecified: Secondary | ICD-10-CM

## 2021-12-23 DIAGNOSIS — R000 Tachycardia, unspecified: Secondary | ICD-10-CM

## 2021-12-23 DIAGNOSIS — R1032 Left lower quadrant pain: Secondary | ICD-10-CM | POA: Diagnosis not present

## 2021-12-23 DIAGNOSIS — R030 Elevated blood-pressure reading, without diagnosis of hypertension: Secondary | ICD-10-CM

## 2021-12-23 NOTE — ED Provider Notes (Signed)
Gabrielle Leach    CSN: [id number] Arrival date & time: 12/23/21  1252      History   Chief Complaint Chief Complaint  Patient presents with   Abdominal Pain    HPI Gabrielle Leach is a 36 y.o. female.  Patient presents with 1 day history of left lower quadrant abdominal pain.  She states this is similar to previous flares of diverticulitis.  She denies nausea, vomiting, diarrhea, constipation, or other symptoms.  Last bowel movement yesterday and no blood noted.  She is febrile here but denies fever at home.  She contacted her PCP and was offered an appointment today but could not accept the time because she has to pick up her child.  Her medical history includes diverticulitis, morbid obesity, fatty liver, OSA, anxiety, hyperlipidemia, elevated blood pressure reading.  The history is provided by the patient and medical records.   Past Medical History:  Diagnosis Date   Anxiety    situatiional due to job   COVID-19 virus infection 08/2019   Fatty liver    History of chicken pox    HLD (hyperlipidemia)    borderline   Liver lesion    Morbid obesity (HCC)    OSA (obstructive sleep apnea) 05/21/2013   mild, set up with CPAP ([facility]) does not use c -pap due to finance issues   Rash    recurrent in axilla    Patient Active Problem List   Diagnosis Date Noted   Acute sinusitis 10/12/2021   Elevated blood pressure reading without diagnosis of hypertension 10/12/2021   Diverticulitis large intestine w/o perforation or abscess w/o bleeding    Acute cystitis with hematuria 08/01/2019   Vitamin B12 deficiency 06/17/2019   Cervical ectropion 07/09/2018   History of colonic diverticulitis 06/11/2018   Fever 09/30/2014   Vitamin D deficiency 05/24/2014   Fatty liver    Liver lesion    Anxiety 07/24/2013   Encounter for general adult medical examination with abnormal findings 06/02/2013   OSA (obstructive sleep apnea) 05/21/2013   Obesity, morbid, BMI 40.0-49.9 (HCC)    HLD  (hyperlipidemia)     Past Surgical History:  Procedure Laterality Date   CHOLECYSTECTOMY N/A 10/21/2013   chronic cholecystitis; Procedure: LAPAROSCOPIC CHOLECYSTECTOMY WITH INTRAOPERATIVE CHOLANGIOGRAM;  Surgeon: Ernestene Mention, MD   COLONOSCOPY WITH PROPOFOL N/A 06/17/2020   Procedure: COLONOSCOPY WITH PROPOFOL;  Surgeon: Toney Reil, MD;  Location: [facility] ENDOSCOPY;  Service: Gastroenterology;  Laterality: N/A;   WISDOM TOOTH EXTRACTION      OB History   No obstetric history on file.      Home Medications    Prior to Admission medications   Medication Sig Start Date End Date Taking? Authorizing Provider  acetaminophen (TYLENOL) 500 MG tablet Take 1,000 mg by mouth every 6 (six) hours as needed for mild pain or fever.     [provider]  ALPRAZolam Prudy Feeler) 0.25 MG tablet Take 0.5-1 tablets (0.125-0.25 mg total) by mouth 2 (two) times daily as needed for anxiety. 06/17/19   Eustaquio Boyden, MD  Cholecalciferol (D3-50) 1.25 MG (50000 UT) capsule TAKE 1 TABLET BY MOUTH ONE TIME PER WEEK 03/17/20   Eustaquio Boyden, MD  Cyanocobalamin (B-12) 1000 MCG SUBL Place 1 tablet under the tongue daily. 06/17/19   Eustaquio Boyden, MD  etonogestrel-ethinyl estradiol Eulah Citizen) 0.12-0.015 MG/24HR vaginal ring Place 1 each vaginally every 28 (twenty-eight) days. Insert vaginally and leave in place for 3 consecutive weeks, then remove for 1 week. 06/17/19  Eustaquio Boyden, MD  ibuprofen (ADVIL,MOTRIN) 200 MG tablet Take 400 mg by mouth every 6 (six) hours as needed for fever, headache or mild pain.    [provider]    Family History Family History  Problem Relation Age of Onset   Diabetes Father        borderline   Heart disease Mother        "hardening of arteries"   Rheum arthritis Mother    CAD Paternal Grandmother 37   CAD Paternal Grandfather 67   Parkinson's disease Paternal Grandfather    CAD Maternal Grandmother 40       thinks heart related    Diabetes Maternal Grandmother    Diabetes Maternal Uncle    Cancer Other        breast - great grandmother   Cancer Other        liver - great grandfather   Hypertension Paternal Aunt    Cancer Cousin 40       breast   Rheum arthritis Maternal Aunt    Rheum arthritis Cousin     Social History Social History   Tobacco Use   Smoking status: Never   Smokeless tobacco: Never  Vaping Use   Vaping Use: Never used  Substance Use Topics   Alcohol use: Yes    Alcohol/week: 6.0 standard drinks    Types: 6 Standard drinks or equivalent per week    Comment: occ   Drug use: No     Allergies   Patient has no known allergies.   Review of Systems Review of Systems  Constitutional:  Negative for chills and fever.  Gastrointestinal:  Positive for abdominal pain. Negative for blood in stool, constipation, diarrhea, nausea and vomiting.  All other systems reviewed and are negative.   Physical Exam Triage Vital Signs ED Triage Vitals  Enc Vitals Group     BP 12/23/21 1300 (!) 156/86     Pulse Rate 12/23/21 1300 (!) 118     Resp 12/23/21 1300 20     Temp 12/23/21 1300 (!) 100.4 F (38 C)     Temp src --      SpO2 12/23/21 1300 98 %     Weight --      Height --      Head Circumference --      Peak Flow --      Pain Score 12/23/21 1258 6     Pain Loc --      Pain Edu? --      Excl. in GC? --    No data found.  Updated Vital Signs BP (!) 156/86    Pulse (!) 118    Temp (!) 100.4 F (38 C)    Resp 20    SpO2 98%   Visual Acuity Right Eye Distance:   Left Eye Distance:   Bilateral Distance:    Right Eye Near:   Left Eye Near:    Bilateral Near:     Physical Exam Vitals and nursing note reviewed.  Constitutional:      General: She is not in acute distress.    Appearance: She is well-developed. She is obese.  HENT:     Mouth/Throat:     Mouth: Mucous membranes are moist.  Cardiovascular:     Rate and Rhythm: Normal rate and regular rhythm.     Heart sounds:  Normal heart sounds.  Pulmonary:     Effort: Pulmonary effort is normal. No respiratory distress.  Breath sounds: Normal breath sounds.  Abdominal:     General: Bowel sounds are normal.     Palpations: Abdomen is soft.     Tenderness: There is abdominal tenderness in the left lower quadrant. There is no right CVA tenderness, left CVA tenderness, guarding or rebound.  Musculoskeletal:     Cervical back: Neck supple.  Skin:    General: Skin is warm and dry.  Neurological:     Mental Status: She is alert.  Psychiatric:        Mood and Affect: Mood normal.        Behavior: Behavior normal.     UC Treatments / Results  Labs (all labs ordered are listed, but only abnormal results are displayed) Labs Reviewed - No data to display  EKG   Radiology No results found.  Procedures Procedures (including critical care time)  Medications Ordered in UC Medications - No data to display  Initial Impression / Assessment and Plan / UC Course  I have reviewed the triage vital signs and the nursing notes.  Pertinent labs & imaging results that were available during my care of the patient were reviewed by me and considered in my medical decision making (see chart for details).   LLQ abdominal pain, fever, tachycardia, elevated blood pressure.  Discussed limitations of evaluation in an urgent care setting.  Instructed patient to go to the emergency department for evaluation.  She is reluctant to go; discussed that I feel she needs a higher level of care including stat lab work and possible CT scan of her abdomen.  Patient states she just wants to be prescribed antibiotic for diverticulitis.  I explained to her that this would not be adequate care given her symptoms.  Discharged with instructions to go to the ED.   Final Clinical Impressions(s) / UC Diagnoses   Final diagnoses:  Left lower quadrant abdominal pain  Fever, unspecified  Tachycardia  Elevated blood pressure reading      Discharge Instructions      Go to the emergency department for evaluation of your abdominal pain and fever.     ED Prescriptions   None    PDMP not reviewed this encounter.   Sharion Balloon, NP 12/23/21 1414

## 2021-12-23 NOTE — Discharge Instructions (Addendum)
Go to the emergency department for evaluation of your abdominal pain and fever.     

## 2021-12-23 NOTE — Telephone Encounter (Signed)
PLEASE NOTE: All timestamps contained within this report are represented as [place] Standard Time. CONFIDENTIALTY NOTICE: This fax transmission is intended only for the addressee. It contains information that is legally privileged, confidential or otherwise protected from use or disclosure. If you are not the intended recipient, you are strictly prohibited from reviewing, disclosing, copying using or disseminating any of this information or taking any action in reliance on or regarding this information. If you have received this fax in error, please notify us immediately by telephone so that we can arrange for its return to [place]. Phone: [phone], Toll-Free: [phone], Fax: [phone] Page: 1 of 2 Call Id: [id number] [place] [facility] - Client TELEPHONE ADVICE RECORD AccessNurse Patient Name: [facility] Gender: Female DOB: Apr 19, 1986 Age: 36 Y 1 M 4 D Return Phone Number: [phone] (Primary) Address: City/ State/ Zip: [place] [address] Client [place] [facility] - Client Client Site [place] - Day Contact Type Call Who Is Calling Patient / Member / Family / Caregiver Call Type Triage / Clinical Relationship To Patient Self Return Phone Number [phone] (Primary) Chief Complaint Abdominal Pain Reason for Call Symptomatic / Request for [place] with the Office - sending message for callback to patient patient has diverticulitis and asking for virtual visit or anti biotic Translation No Nurse Assessment Nurse: Mariel Sleet, RN, Erasmo Downer Date/Time (Eastern Time): 12/23/2021 10:04:27 AM Confirm and document reason for call. If symptomatic, describe symptoms. ---Caller states that she has some abdominal pain since yesterday, in left side. Does the patient have any new or worsening symptoms? ---Yes Will a triage be completed? ---Yes Related visit to physician within the last  2 weeks? ---No Does the PT have any chronic conditions? (i.e. diabetes, asthma, this includes High risk factors for pregnancy, etc.) ---No Is the patient pregnant or possibly pregnant? (Ask all females between the ages of 87-55) ---No Is this a behavioral health or substance abuse call? ---No Guidelines Guideline Title Affirmed Question Affirmed Notes Nurse Date/Time (Eastern Time) Abdominal Pain - Female [1] MILDMODERATE pain AND [2] constant AND [3] present > 2 hours Mariel Sleet, RN, Erasmo Downer 12/23/2021 10:05:14 AM Disp. Time Eilene Ghazi Time) Disposition Final User 12/23/2021 10:08:58 AM See HCP within 4 Hours (or PCP triage) Yes Mariel Sleet, RN, Erasmo Downer PLEASE NOTE: All timestamps contained within this report are represented as [place] Standard Time. CONFIDENTIALTY NOTICE: This fax transmission is intended only for the addressee. It contains information that is legally privileged, confidential or otherwise protected from use or disclosure. If you are not the intended recipient, you are strictly prohibited from reviewing, disclosing, copying using or disseminating any of this information or taking any action in reliance on or regarding this information. If you have received this fax in error, please notify us immediately by telephone so that we can arrange for its return to [place]. Phone: [phone], Toll-Free: [phone], Fax: [phone] Page: 2 of 2 Call Id: [id number] [place] Disagree/Comply Comply Caller Understands Yes PreDisposition Call Doctor Care Advice Given Per Guideline SEE HCP (OR PCP TRIAGE) WITHIN 4 HOURS: * IF OFFICE WILL BE OPEN: You need to be seen within the next 3 or 4 hours. Call your doctor (or NP/PA) now or as soon as the office opens. CALL BACK IF: * You become worse CARE ADVICE given per Abdominal Pain, Female (Adult) guideline. Referrals REFERRED TO PCP OFFICE

## 2021-12-23 NOTE — Telephone Encounter (Signed)
Spoke to patient by telephone and was advised that she knows that she is having a flare-up with diverticulitis and just wants an antibiotic called in. Patient was advised that she would need a face to face evaluation to make sure that she is treated with the right medications. Patient was offered an appointment with [facility] this afternoon which she declined. 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 Patient was given information on [facility] on [address]. Patient was advised that [facility] has appointments available tonight at 6:00 pm and 7:00 pm. Patient declined either of those appointments. Patient stated that she will plan on going to [facility] after she picks her son up this afternoon. Patient stated that her pain level is about a 3-4 but is constant. Patient was given ER precautions and she verbalized understanding.

## 2021-12-23 NOTE — ED Triage Notes (Signed)
Pt here with a diverticulitis exacerbation. She reports LLQ pain consistent with previous diverticulitis flare-ups. Denies bloody stools. She was initially scheduled for 2 virtual visits today, but they both called her back and stated they needed to see her in person. She was offered an appt at 3, but she needs to pick up her son at that time. No further assistance was given and she was instructed to come here for an evaluation.

## 2021-12-26 ENCOUNTER — Telehealth: Payer: Self-pay | Admitting: Family Medicine

## 2021-12-27 NOTE — Telephone Encounter (Signed)
Lvm asking pt to call back.  Need to relay Dr. Timoteo Expose message, get update on sxs and schedule OV today at 12:30 at [facility], if pt agrees.

## 2021-12-27 NOTE — Telephone Encounter (Signed)
Seen at [facility] on Friday, advised f/u at ER. Don't see where she was evaluated. Plz call for f/u on symptoms and to offer OV for further evaluation. May place at 12:30pm, let me know if needs a specific time.

## 2021-12-28 NOTE — Telephone Encounter (Signed)
Lvm asking pt to call back.  Need to relay Dr. Synthia Innocent message, get update on sxs and schedule OV, if pt agrees.

## 2021-12-29 NOTE — Telephone Encounter (Signed)
Lvm asking pt to call back.  Need to relay Dr. Timoteo Expose message, get update on sxs and schedule OV, if pt agrees.   I'm also sending a MyChart message.

## 2022-01-30 ENCOUNTER — Ambulatory Visit: Payer: BC Managed Care – PPO | Admitting: Podiatry

## 2022-01-30 ENCOUNTER — Ambulatory Visit (INDEPENDENT_AMBULATORY_CARE_PROVIDER_SITE_OTHER): Payer: BC Managed Care – PPO

## 2022-01-30 ENCOUNTER — Other Ambulatory Visit: Payer: Self-pay

## 2022-01-30 DIAGNOSIS — M2550 Pain in unspecified joint: Secondary | ICD-10-CM | POA: Diagnosis not present

## 2022-01-30 DIAGNOSIS — M722 Plantar fascial fibromatosis: Secondary | ICD-10-CM

## 2022-01-30 DIAGNOSIS — Q6671 Congenital pes cavus, right foot: Secondary | ICD-10-CM | POA: Diagnosis not present

## 2022-01-30 DIAGNOSIS — Z8261 Family history of arthritis: Secondary | ICD-10-CM | POA: Diagnosis not present

## 2022-01-30 DIAGNOSIS — Q6672 Congenital pes cavus, left foot: Secondary | ICD-10-CM

## 2022-01-30 MED ORDER — MELOXICAM 15 MG PO TABS: 15.0000 mg | ORAL_TABLET | Freq: Every day | ORAL | 3 refills | Status: AC

## 2022-01-30 NOTE — Patient Instructions (Signed)
Plantar Fasciitis (Heel Spur Syndrome) °with Rehab °The plantar fascia is a fibrous, ligament-like, soft-tissue structure that spans the bottom of the foot. Plantar fasciitis is a condition that causes pain in the foot due to inflammation of the tissue. °SYMPTOMS  °Pain and tenderness on the underneath side of the foot. °Pain that worsens with standing or walking. °CAUSES  °Plantar fasciitis is caused by irritation and injury to the plantar fascia on the underneath side of the foot. Common mechanisms of injury include: °Direct trauma to bottom of the foot. °Damage to a small nerve that runs under the foot where the main fascia attaches to the heel bone. °Stress placed on the plantar fascia due to bone spurs. °RISK INCREASES WITH:  °Activities that place stress on the plantar fascia (running, jumping, pivoting, or cutting). °Poor strength and flexibility. °Improperly fitted shoes. °Tight calf muscles. °Flat feet. °Failure to warm-up properly before activity. °Obesity. °PREVENTION °Warm up and stretch properly before activity. °Allow for adequate recovery between workouts. °Maintain physical fitness: °Strength, flexibility, and endurance. °Cardiovascular fitness. °Maintain a health body weight. °Avoid stress on the plantar fascia. °Wear properly fitted shoes, including arch supports for individuals who have flat feet. ° °PROGNOSIS  °If treated properly, then the symptoms of plantar fasciitis usually resolve without surgery. However, occasionally surgery is necessary. ° °RELATED COMPLICATIONS  °Recurrent symptoms that may result in a chronic condition. °Problems of the lower back that are caused by compensating for the injury, such as limping. °Pain or weakness of the foot during push-off following surgery. °Chronic inflammation, scarring, and partial or complete fascia tear, occurring more often from repeated injections. ° °TREATMENT  °Treatment initially involves the use of ice and medication to help reduce pain and  inflammation. The use of strengthening and stretching exercises may help reduce pain with activity, especially stretches of the Achilles tendon. These exercises may be performed at home or with a therapist. Your caregiver may recommend that you use heel cups of arch supports to help reduce stress on the plantar fascia. Occasionally, corticosteroid injections are given to reduce inflammation. If symptoms persist for greater than 6 months despite non-surgical (conservative), then surgery may be recommended.  ° °MEDICATION  °If pain medication is necessary, then nonsteroidal anti-inflammatory medications, such as aspirin and ibuprofen, or other minor pain relievers, such as acetaminophen, are often recommended. °Do not take pain medication within 7 days before surgery. °Prescription pain relievers may be given if deemed necessary by your caregiver. Use only as directed and only as much as you need. °Corticosteroid injections may be given by your caregiver. These injections should be reserved for the most serious cases, because they may only be given a certain number of times. ° °HEAT AND COLD °Cold treatment (icing) relieves pain and reduces inflammation. Cold treatment should be applied for 10 to 15 minutes every 2 to 3 hours for inflammation and pain and immediately after any activity that aggravates your symptoms. Use ice packs or massage the area with a piece of ice (ice massage). °Heat treatment may be used prior to performing the stretching and strengthening activities prescribed by your caregiver, physical therapist, or athletic trainer. Use a heat pack or soak the injury in warm water. ° °SEEK IMMEDIATE MEDICAL CARE IF: °Treatment seems to offer no benefit, or the condition worsens. °Any medications produce adverse side effects. ° °EXERCISES- °RANGE OF MOTION (ROM) AND STRETCHING EXERCISES - Plantar Fasciitis (Heel Spur Syndrome) °These exercises may help you when beginning to rehabilitate your injury. Your    symptoms may resolve with or without further involvement from your physician, physical therapist or athletic trainer. While completing these exercises, remember:  °Restoring tissue flexibility helps normal motion to return to the joints. This allows healthier, less painful movement and activity. °An effective stretch should be held for at least 30 seconds. °A stretch should never be painful. You should only feel a gentle lengthening or release in the stretched tissue. ° °RANGE OF MOTION - Toe Extension, Flexion °Sit with your right / left leg crossed over your opposite knee. °Grasp your toes and gently pull them back toward the top of your foot. You should feel a stretch on the bottom of your toes and/or foot. °Hold this stretch for 10 seconds. °Now, gently pull your toes toward the bottom of your foot. You should feel a stretch on the top of your toes and or foot. °Hold this stretch for 10 seconds. °Repeat  times. Complete this stretch 3 times per day.  ° °RANGE OF MOTION - Ankle Dorsiflexion, Active Assisted °Remove shoes and sit on a chair that is preferably not on a carpeted surface. °Place right / left foot under knee. Extend your opposite leg for support. °Keeping your heel down, slide your right / left foot back toward the chair until you feel a stretch at your ankle or calf. If you do not feel a stretch, slide your bottom forward to the edge of the chair, while still keeping your heel down. °Hold this stretch for 10 seconds. °Repeat 3 times. Complete this stretch 2 times per day.  ° °STRETCH  Gastroc, Standing °Place hands on wall. °Extend right / left leg, keeping the front knee somewhat bent. °Slightly point your toes inward on your back foot. °Keeping your right / left heel on the floor and your knee straight, shift your weight toward the wall, not allowing your back to arch. °You should feel a gentle stretch in the right / left calf. Hold this position for 10 seconds. °Repeat 3 times. Complete this  stretch 2 times per day. ° °STRETCH  Soleus, Standing °Place hands on wall. °Extend right / left leg, keeping the other knee somewhat bent. °Slightly point your toes inward on your back foot. °Keep your right / left heel on the floor, bend your back knee, and slightly shift your weight over the back leg so that you feel a gentle stretch deep in your back calf. °Hold this position for 10 seconds. °Repeat 3 times. Complete this stretch 2 times per day. ° °STRETCH  Gastrocsoleus, Standing  °Note: This exercise can place a lot of stress on your foot and ankle. Please complete this exercise only if specifically instructed by your caregiver.  °Place the ball of your right / left foot on a step, keeping your other foot firmly on the same step. °Hold on to the wall or a rail for balance. °Slowly lift your other foot, allowing your body weight to press your heel down over the edge of the step. °You should feel a stretch in your right / left calf. °Hold this position for 10 seconds. °Repeat this exercise with a slight bend in your right / left knee. °Repeat 3 times. Complete this stretch 2 times per day.  ° °STRENGTHENING EXERCISES - Plantar Fasciitis (Heel Spur Syndrome)  °These exercises may help you when beginning to rehabilitate your injury. They may resolve your symptoms with or without further involvement from your physician, physical therapist or athletic trainer. While completing these exercises, remember:  °Muscles can   gain both the endurance and the strength needed for everyday activities through controlled exercises. °Complete these exercises as instructed by your physician, physical therapist or athletic trainer. Progress the resistance and repetitions only as guided. ° °STRENGTH - Towel Curls °Sit in a chair positioned on a non-carpeted surface. °Place your foot on a towel, keeping your heel on the floor. °Pull the towel toward your heel by only curling your toes. Keep your heel on the floor. °Repeat 3 times.  Complete this exercise 2 times per day. ° °STRENGTH - Ankle Inversion °Secure one end of a rubber exercise band/tubing to a fixed object (table, pole). Loop the other end around your foot just before your toes. °Place your fists between your knees. This will focus your strengthening at your ankle. °Slowly, pull your big toe up and in, making sure the band/tubing is positioned to resist the entire motion. °Hold this position for 10 seconds. °Have your muscles resist the band/tubing as it slowly pulls your foot back to the starting position. °Repeat 3 times. Complete this exercises 2 times per day.  °Document Released: 11/13/2005 Document Revised: 02/05/2012 Document Reviewed: 02/25/2009 °[employer]® Patient Information ©2014 [employer]. ° °

## 2022-01-30 NOTE — Progress Notes (Signed)
?  Subjective:  ?Patient ID: Gabrielle Leach, female    DOB: 1986-10-23,  MRN: [id number] ? ?Chief Complaint  ?Patient presents with  ? Foot Pain  ? ? ?36 y.o. female presents with the above complaint. History confirmed with patient.  Pain has been bothering some on both feet mostly in the heels for 2 to 3 years off-and-on.  Worse in the morning or when she gets up from a long seated time.  She does have a history of rheumatoid arthritis her mother was diagnosed in her mid 14s and she inquires if this is possibly related.  She does note some pain in her other joints such as her knees and hips.  None in the upper extremity. ? ?Objective:  ?Physical Exam: ?warm, good capillary refill, no trophic changes or ulcerative lesions, normal DP and PT pulses, normal sensory exam.  She has moderate swelling in the joints of the hindfoot and forefoot ?Left Foot: point tenderness over the heel pad ?Right Foot: point tenderness over the heel pad ? ?No images are attached to the encounter. ? ?Radiographs: ?Multiple views x-ray of both feet: no fracture, dislocation, swelling or degenerative changes noted and plantar calcaneal spur ?Assessment:  ? ?1. Plantar fasciitis   ?2. Family history of rheumatoid arthritis   ?3. Polyarthralgia   ?4. Pes cavus of both feet   ? ? ? ?Plan:  ?Patient was evaluated and treated and all questions answered. ? ?Discussed with her the possibility of polyarthritis and possibility of rheumatoid arthritis.  Her mother has a history of this and I recommend we evaluate her for this as well she has never been checked for.  I have ordered lab work to evaluate for this.  Pending results of this will refer as needed to rheumatology. ? ?Discussed the etiology and treatment options for plantar fasciitis including stretching, formal physical therapy, supportive shoegears such as a running shoe or sneaker, pre fabricated orthoses, injection therapy, and oral medications. We also discussed the role of surgical  treatment of this for patients who do not improve after exhausting non-surgical treatment options. ? ? ?-XR reviewed with patient ?-Educated patient on stretching and icing of the affected limb ?-Rx for meloxicam. Educated on use, risks and benefits of the medication ?-We also discussed corticosteroid injection to give direct and fairly immediate relief.  She prefers to avoid injections at this point.  Recommended we continue with home therapy plan and meloxicam and if not improved by next visit and proceed with corticosteroid injection ? ?Return in about 1 month (around 03/02/2022).  ? ?

## 2022-02-03 ENCOUNTER — Other Ambulatory Visit: Payer: Self-pay

## 2022-02-03 ENCOUNTER — Ambulatory Visit (INDEPENDENT_AMBULATORY_CARE_PROVIDER_SITE_OTHER): Payer: BC Managed Care – PPO

## 2022-02-03 DIAGNOSIS — M722 Plantar fascial fibromatosis: Secondary | ICD-10-CM

## 2022-02-03 DIAGNOSIS — Q6671 Congenital pes cavus, right foot: Secondary | ICD-10-CM

## 2022-02-03 NOTE — Progress Notes (Signed)
SITUATION ?Reason for Consult: Evaluation for Bilateral Custom Foot Orthoses ?Patient / Caregiver Report: Patient is ready for foot orthotics ? ?OBJECTIVE DATA: ?Patient History / Diagnosis:  ?  ICD-10-CM   ?1. Plantar fasciitis  M72.2   ?  ?2. Pes cavus of both feet  Q66.71   ? Q66.72   ?  ? ? ?Current or Previous Devices: None and no history ? ?Foot Examination: ?Skin presentation:   Intact ?Ulcers & Callousing:   None and no history ?Toe / Foot Deformities:  Pes Cavus ?Weight Bearing Presentation:  Cavus ?Sensation:    Intact ? ?ORTHOTIC RECOMMENDATION ?Recommended Device: 1x pair of custom functional foot orthotics ? ?Shoe Size 88M ? ?GOALS OF ORTHOSES ?- Reduce Pain ?- Prevent Foot Deformity ?- Prevent Progression of Further Foot Deformity ?- Relieve Pressure ?- Improve the Overall Biomechanical Function of the Foot and Lower Extremity. ? ?ACTIONS PERFORMED ?Patient was casted for Foot Orthoses via crush box. Procedure was explained and patient tolerated procedure well. All questions were answered and concerns addressed. ? ?PLAN ?Potential out of pocket cost was communicated to patient. Casts are to be sent to [facility] for fabrication. Patient is to be called for fitting when devices are ready.  ? ? ?

## 2022-02-09 ENCOUNTER — Encounter: Payer: Self-pay | Admitting: Podiatry

## 2022-02-11 LAB — HLA-B27 ANTIGEN: HLA B27: NEGATIVE

## 2022-02-11 LAB — FANA STAINING PATTERNS: Speckled Pattern: 1:320 {titer} — ABNORMAL HIGH

## 2022-02-11 LAB — CBC WITH DIFFERENTIAL/PLATELET
Basophils Absolute: 0 10*3/uL (ref 0.0–0.2)
Basos: 0 %
EOS (ABSOLUTE): 0.1 10*3/uL (ref 0.0–0.4)
Eos: 1 %
Hematocrit: 39.3 % (ref 34.0–46.6)
Hemoglobin: 13.2 g/dL (ref 11.1–15.9)
Immature Grans (Abs): 0 10*3/uL (ref 0.0–0.1)
Immature Granulocytes: 0 %
Lymphocytes Absolute: 2.3 10*3/uL (ref 0.7–3.1)
Lymphs: 26 %
MCH: 30.1 pg (ref 26.6–33.0)
MCHC: 33.6 g/dL (ref 31.5–35.7)
MCV: 90 fL (ref 79–97)
Monocytes Absolute: 0.6 10*3/uL (ref 0.1–0.9)
Monocytes: 6 %
Neutrophils Absolute: 5.9 10*3/uL (ref 1.4–7.0)
Neutrophils: 67 %
Platelets: 336 10*3/uL (ref 150–450)
RBC: 4.39 x10E6/uL (ref 3.77–5.28)
RDW: 12.3 % (ref 11.7–15.4)
WBC: 8.8 10*3/uL (ref 3.4–10.8)

## 2022-02-11 LAB — SEDIMENTATION RATE: Sed Rate: 26 mm/hr (ref 0–32)

## 2022-02-11 LAB — ANA,IFA RA DIAG PNL W/RFLX TIT/PATN
ANA Titer 1: POSITIVE — AB
Cyclic Citrullin Peptide Ab: 4 units (ref 0–19)
Rheumatoid fact SerPl-aCnc: 13 IU/mL (ref <14.0)

## 2022-02-17 NOTE — Telephone Encounter (Signed)
Referral and office notes/labs have been faxed to [facility]  Fax# [phone] ?

## 2022-02-17 NOTE — Telephone Encounter (Signed)
Have  you had a chance to send this to [facility] yet?

## 2022-02-27 ENCOUNTER — Encounter: Payer: Self-pay | Admitting: Podiatry

## 2022-02-27 ENCOUNTER — Ambulatory Visit: Payer: BC Managed Care – PPO | Admitting: Family Medicine

## 2022-02-27 ENCOUNTER — Ambulatory Visit: Payer: BC Managed Care – PPO | Admitting: Podiatry

## 2022-02-27 ENCOUNTER — Encounter: Payer: Self-pay | Admitting: Family Medicine

## 2022-02-27 VITALS — BP 116/82 | HR 86 | Temp 97.3°F | Ht 63.0 in | Wt 264.5 lb

## 2022-02-27 DIAGNOSIS — K760 Fatty (change of) liver, not elsewhere classified: Secondary | ICD-10-CM

## 2022-02-27 DIAGNOSIS — Z8261 Family history of arthritis: Secondary | ICD-10-CM

## 2022-02-27 DIAGNOSIS — E559 Vitamin D deficiency, unspecified: Secondary | ICD-10-CM | POA: Diagnosis not present

## 2022-02-27 DIAGNOSIS — R768 Other specified abnormal immunological findings in serum: Secondary | ICD-10-CM

## 2022-02-27 DIAGNOSIS — R41840 Attention and concentration deficit: Secondary | ICD-10-CM

## 2022-02-27 DIAGNOSIS — K769 Liver disease, unspecified: Secondary | ICD-10-CM

## 2022-02-27 DIAGNOSIS — Z8719 Personal history of other diseases of the digestive system: Secondary | ICD-10-CM

## 2022-02-27 DIAGNOSIS — M722 Plantar fascial fibromatosis: Secondary | ICD-10-CM

## 2022-02-27 DIAGNOSIS — E538 Deficiency of other specified B group vitamins: Secondary | ICD-10-CM

## 2022-02-27 LAB — COMPREHENSIVE METABOLIC PANEL
ALT: 163 U/L — ABNORMAL HIGH (ref 0–35)
AST: 102 U/L — ABNORMAL HIGH (ref 0–37)
Albumin: 4 g/dL (ref 3.5–5.2)
Alkaline Phosphatase: 59 U/L (ref 39–117)
BUN: 9 mg/dL (ref 6–23)
CO2: 27 mEq/L (ref 19–32)
Calcium: 9.6 mg/dL (ref 8.4–10.5)
Chloride: 102 mEq/L (ref 96–112)
Creatinine, Ser: 0.78 mg/dL (ref 0.40–1.20)
GFR: 97.13 mL/min (ref >60.00)
Glucose, Bld: 86 mg/dL (ref 70–99)
Potassium: 3.9 mEq/L (ref 3.5–5.1)
Sodium: 138 mEq/L (ref 135–145)
Total Bilirubin: 0.2 mg/dL (ref 0.2–1.2)
Total Protein: 6.9 g/dL (ref 6.0–8.3)

## 2022-02-27 LAB — TSH: TSH: 3.41 u[IU]/mL (ref 0.35–5.50)

## 2022-02-27 LAB — VITAMIN B12: Vitamin B-12: 353 pg/mL (ref 211–911)

## 2022-02-27 LAB — VITAMIN D 25 HYDROXY (VIT D DEFICIENCY, FRACTURES): VITD: 46.69 ng/mL (ref 30.00–100.00)

## 2022-02-27 MED ORDER — ATOMOXETINE HCL 40 MG PO CAPS: 40.0000 mg | ORAL_CAPSULE | Freq: Every day | ORAL | 3 refills | Status: AC

## 2022-02-27 NOTE — Progress Notes (Signed)
? ? Patient ID: Gabrielle Leach, female    DOB: [birth year], 36 y.o.   MRN: [id number] ? ?This visit was conducted in person. ? ?BP 116/82   Pulse 86   Temp (!) 97.3 ?F (36.3 ?C) (Temporal)   Ht 5\' 3"  (1.6 m)   Wt 264 lb 8 oz (120 kg)   LMP 02/21/2022   SpO2 99%   BMI 46.85 kg/m?   ? ?CC: memory trouble ?Subjective:  ? ?HPI: ?Gabrielle RouteKeshia E Paske is a 36 y.o. female presenting on 02/27/2022 for Memory Loss (C/o memory, focus and concentration issues. ) ? ? ?Saw podiatrist earlier this morning for plantar fasciitis. Recently referred to rheumatology for positive ANA with high speckled titer 1:320. Seeing [facility] next week. Her RF and anti-CCP were normal.  ? ?Fmhx RA - mother.  ? ?Se[facility]/20[facility] for LLQ pain concern for diverticulitis, referred to ER. Did not go, symptoms improved with leftover antibiotics. She was frustrated at difficulty to get the help she needed during this time, felt unheard, worsened stress. ? ?Longstanding h/o difficulty with attention. Tends to procrastinate, difficulty organizing. More recently husband has noticed and mentioned this as well.  ? ?Looked online - bought Avantera supplement with turmeric, lion's mane, black pepper extract, bacopa extract, and caffeine/green tea leaf 45mg .  ? ?Notes longstanding difficulty with anxiety, irritability. Stress as work Runner, broadcasting/film/videoteacher as well as symptoms present at home. Previously on xanax PRN anxiety attacks, no recent need. Has never been on daily mood medicine.  ? ?Pt endorses persistent pattern of inattention and or hyperactivity/impulsivity that interferes with daily functioning. Symptoms present in 2 or more settings (home and school). Symptoms present before [age] years of age? No  ?Inattention (6+, 6 months): ?Careless mistakes? yes ?Difficulty sustaining attention? yes ?Doesn't listen when spoken to directly? yes ?Lacks follow through with instructions or difficulty completing tasks? yes ?Difficulty organizing tasks/activities? yes ?Avoiding tasks  that require sustained attention? yes ?Easily losing things needed for tasks? yes ?Easily distracted by external stimuli? yes ?Forgetful in daily activities? yes ?Hyperactive/impulsive (6+, 6 months): ?Fidgeting, tapping hands/feet? no ?Leaves seat when expected to remain in seat? no ?Feeling restless, or running around when not expected? yes ?Unable to engage in leisurely activities quietly? no ?Always on the go, "driven by a motor"? no ?Excessive talking? maybe ?Blurting out answer, responds to other's questions? no ?Difficulty waiting in line or waiting turn? no ?Interrupting or intruding on others? yes  ?   ? ?Relevant past medical, surgical, family and social history reviewed and updated as indicated. Interim medical history since our last visit reviewed. ?Allergies and medications reviewed and updated. ?Outpatient Medications Prior to Visit  ?Medication Sig Dispense Refill  ? acetaminophen (TYLENOL) 500 MG tablet Take 1,000 mg by mouth every 6 (six) hours as needed for mild pain or fever.     ? ALPRAZolam (XANAX) 0.25 MG tablet Take 0.5-1 tablets (0.125-0.25 mg total) by mouth 2 (two) times daily as needed for anxiety. 20 tablet 0  ? Cholecalciferol (D3-50) 1.25 MG (50000 UT) capsule TAKE 1 TABLET BY MOUTH ONE TIME PER WEEK 12 capsule 1  ? Cyanocobalamin (B-12) 1000 MCG SUBL Place 1 tablet under the tongue daily.    ? etonogestrel-ethinyl estradiol (ELURYNG) 0.12-0.015 MG/24HR vaginal ring Place 1 each vaginally every 28 (twenty-eight) days. Insert vaginally and leave in place for 3 consecutive weeks, then remove for 1 week. 3 each 3  ? ibuprofen (ADVIL,MOTRIN) 200 MG tablet Take 400 mg by mouth every  6 (six) hours as needed for fever, headache or mild pain.    ? meloxicam (MOBIC) 15 MG tablet Take 1 tablet (15 mg total) by mouth daily. 30 tablet 3  ? ?No facility-administered medications prior to visit.  ?  ? ?Per HPI unless specifically indicated in ROS section below ?Review of Systems ? ?Objective:  ?BP  116/82   Pulse 86   Temp (!) 97.3 ?F (36.3 ?C) (Temporal)   Ht 5\' 3"  (1.6 m)   Wt 264 lb 8 oz (120 kg)   LMP 02/21/2022   SpO2 99%   BMI 46.85 kg/m?   ?Wt Readings from Last 3 Encounters:  ?02/27/22 264 lb 8 oz (120 kg)  ?10/12/21 263 lb (119.3 kg)  ?06/17/20 250 lb (113.4 kg)  ?  ?  ?Physical Exam ?Vitals and nursing note reviewed.  ?Constitutional:   ?   Appearance: Normal appearance. She is obese. She is not ill-appearing.  ?Neck:  ?   Thyroid: No thyroid mass or thyromegaly.  ?Cardiovascular:  ?   Rate and Rhythm: Normal rate and regular rhythm.  ?   Pulses: Normal pulses.  ?   Heart sounds: Normal heart sounds. No murmur heard. ?Pulmonary:  ?   Effort: Pulmonary effort is normal. No respiratory distress.  ?   Breath sounds: Normal breath sounds. No wheezing, rhonchi or rales.  ?Abdominal:  ?   General: Bowel sounds are normal. There is no distension.  ?   Palpations: Abdomen is soft. There is no hepatomegaly, splenomegaly or mass.  ?   Tenderness: There is no abdominal tenderness.  ?Musculoskeletal:  ?   Right lower leg: No edema.  ?   Left lower leg: No edema.  ?Skin: ?   General: Skin is warm and dry.  ?   Findings: No rash.  ?Neurological:  ?   Mental Status: She is alert.  ?Psychiatric:     ?   Mood and Affect: Mood normal.     ?   Behavior: Behavior normal.  ? ?   ?Results for orders placed or performed in visit on 02/27/22  ?Comprehensive metabolic panel  ?Result Value Ref Range  ? Sodium 138 135 - 145 mEq/L  ? Potassium 3.9 3.5 - 5.1 mEq/L  ? Chloride 102 96 - 112 mEq/L  ? CO2 27 19 - 32 mEq/L  ? Glucose, Bld 86 70 - 99 mg/dL  ? BUN 9 6 - 23 mg/dL  ? Creatinine, Ser 0.78 0.40 - 1.20 mg/dL  ? Total Bilirubin 0.2 0.2 - 1.2 mg/dL  ? Alkaline Phosphatase 59 39 - 117 U/L  ? AST 102 (H) 0 - 37 U/L  ? ALT 163 (H) 0 - 35 U/L  ? Total Protein 6.9 6.0 - 8.3 g/dL  ? Albumin 4.0 3.5 - 5.2 g/dL  ? GFR 97.13 >60.00 mL/min  ? Calcium 9.6 8.4 - 10.5 mg/dL  ?Vitamin B12  ?Result Value Ref Range  ? Vitamin B-12 353  211 - 911 pg/mL  ?VITAMIN D 25 Hydroxy (Vit-D Deficiency, Fractures)  ?Result Value Ref Range  ? VITD 46.69 30.00 - 100.00 ng/mL  ?TSH  ?Result Value Ref Range  ? TSH 3.41 0.35 - 5.50 uIU/mL  ? ? ?Assessment & Plan:  ? ?Problem List Items Addressed This Visit   ? ? Fatty liver  ?  Update LFTs prior to consider starting Avantera supplement.  ?  ?  ? Relevant Orders  ? Comprehensive metabolic panel (Completed)  ? MR Abdomen W Wo  Contrast  ? Liver lesion  ?  H/o focal fatty sparing vs focal nodular hyperplasia.  ?Check LFTs - if elevated, consider rpt imaging (last done 2016).  ?  ?  ? Relevant Orders  ? MR Abdomen W Wo Contrast  ? Vitamin D deficiency  ?  Update levels on intermittent weekly replacement.  ?  ?  ? Relevant Orders  ? VITAMIN D 25 Hydroxy (Vit-D Deficiency, Fractures) (Completed)  ? History of colonic diverticulitis  ?  H/o recurrent diverticulitis, s/p reassuring colonoscopy 2021.  ?Discussed clear liquid diet as first line of treatment then antibiotics if failing to improve.  ?Virtual visit would be reasonable if symptoms develop given recurrent history.  ?  ?  ? Vitamin B12 deficiency  ?  Update levels in intermittent replacement.  ?  ?  ? Relevant Orders  ? Vitamin B12 (Completed)  ? Inattention - Primary  ?  Meets criteria for adult ADD.  ?Discussed treatment options including stimulant vs non stimulant and risks/benefits of each.  ?If stimulant desired, would recommend formal ADHD testing through psychology. She is interested in trial of strattera - 40mg  dose sent to pharmacy. Update if unaffordable or with effect if tried. Watch for  ?Check LFTs prior to starting strattera or considering OTC supplement.  ?  ?  ? Relevant Orders  ? TSH (Completed)  ? Positive ANA (antinuclear antibody)  ?  Pending rheum eval.  ?  ?  ?  ? ?Meds ordered this encounter  ?Medications  ? atomoxetine (STRATTERA) 40 MG capsule  ?  Sig: Take 1 capsule (40 mg total) by mouth daily.  ?  Dispense:  30 capsule  ?  Refill:  3   ? ?Orders Placed This Encounter  ?Procedures  ? MR Abdomen W Wo Contrast  ?  Standing Status:   Future  ?  Standing Expiration Date:   03/01/2023  ?  Order Specific Question:   If indicated for the ordered procedure, I

## 2022-02-27 NOTE — Progress Notes (Signed)
?  Subjective:  ?Patient ID: Gabrielle Leach, female    DOB: [birth year],  MRN: [id number] ? ?Chief Complaint  ?Patient presents with  ? Plantar Fasciitis  ?  1 month follow up bilateral ?  ? ? ?36 y.o. female presents with the above complaint. History confirmed with patient.  Pain has been bothering some on both feet mostly in the heels for 2 to 3 years off-and-on.  Worse in the morning or when she gets up from a long seated time.  She does have a history of rheumatoid arthritis her mother was diagnosed in her mid 64s and she inquires if this is possibly related.  She does note some pain in her other joints such as her knees and hips.  None in the upper extremity. ? ? ?Interval history: Pain is doing much better with the meloxicam she says it is about 70-80% better.  She has a appointment scheduled with rheumatology ? ?Objective:  ?Physical Exam: ?warm, good capillary refill, no trophic changes or ulcerative lesions, normal DP and PT pulses, normal sensory exam.  Good range of motion no pain or swelling today, no pain in heels ? ?Radiographs: ?Multiple views x-ray of both feet: no fracture, dislocation, swelling or degenerative changes noted and plantar calcaneal spur ?Assessment:  ? ?1. Plantar fasciitis   ?2. Family history of rheumatoid arthritis   ? ? ? ?Plan:  ?Patient was evaluated and treated and all questions answered. ? ?Doing much better I recommend she continue meloxicam as needed and doing stretches twice daily.  She says she has not been as consistent with this as she should be, I discussed with her that this is probably most important thing to make her Planter fasciitis better.  She was fitted for custom molded orthotics already and when these are ready we will let her know.  Hopefully should resolve in the next 6 weeks.  She has upcoming appoint with rheumatology as well which will help her go over her lab results and decide on any further treatment ? ?Return if symptoms worsen or fail to improve.  ? ?

## 2022-02-27 NOTE — Patient Instructions (Addendum)
Labs today  ?You do screen positive for adult ADD.  ?Trial strattera sent to pharmacy. Update me if unaffordable or with effect.  ?Good to see you today. Good luck in your upcoming move!  ?

## 2022-02-28 ENCOUNTER — Encounter: Payer: Self-pay | Admitting: Family Medicine

## 2022-02-28 DIAGNOSIS — R41840 Attention and concentration deficit: Secondary | ICD-10-CM | POA: Insufficient documentation

## 2022-02-28 DIAGNOSIS — R768 Other specified abnormal immunological findings in serum: Secondary | ICD-10-CM | POA: Insufficient documentation

## 2022-02-28 NOTE — Assessment & Plan Note (Signed)
Update levels in intermittent replacement.  ?

## 2022-02-28 NOTE — Assessment & Plan Note (Addendum)
H/o focal fatty sparing vs focal nodular hyperplasia.  ?Check LFTs - if elevated, consider rpt imaging (last done 2016).  ?

## 2022-02-28 NOTE — Assessment & Plan Note (Addendum)
Pending rheum eval.  ?

## 2022-02-28 NOTE — Assessment & Plan Note (Addendum)
Meets criteria for adult ADD.  ?Discussed treatment options including stimulant vs non stimulant and risks/benefits of each.  ?If stimulant desired, would recommend formal ADHD testing through psychology. She is interested in trial of strattera - 40mg  dose sent to pharmacy. Update if unaffordable or with effect if tried. Watch for  ?Check LFTs prior to starting strattera or considering OTC supplement.  ?

## 2022-02-28 NOTE — Assessment & Plan Note (Signed)
Update levels on intermittent weekly replacement.  ?

## 2022-02-28 NOTE — Assessment & Plan Note (Addendum)
H/o recurrent diverticulitis, s/p reassuring colonoscopy 2021.  ?Discussed clear liquid diet as first line of treatment then antibiotics if failing to improve.  ?Virtual visit would be reasonable if symptoms develop given recurrent history.  ?

## 2022-02-28 NOTE — Assessment & Plan Note (Signed)
Update LFTs prior to consider starting Avantera supplement.  ?

## 2022-03-01 ENCOUNTER — Telehealth: Payer: Self-pay | Admitting: Podiatry

## 2022-03-01 NOTE — Telephone Encounter (Signed)
Lvm for patient to call and schedule an appointment to pick up orthotics  ?

## 2022-03-07 ENCOUNTER — Telehealth: Payer: Self-pay

## 2022-03-07 NOTE — Telephone Encounter (Signed)
Received faxed PA form from [facility] for Strattera 40 mg tab.  Placed form in Dr. Synthia Innocent box.  ?

## 2022-03-08 NOTE — Telephone Encounter (Signed)
Faxed PA form.  Decision pending.  

## 2022-03-08 NOTE — Telephone Encounter (Signed)
Filled and in Lisa's box 

## 2022-03-09 LAB — HEPATIC FUNCTION PANEL
ALT: 71 U/L — AB (ref 7–35)
AST: 35 (ref 13–35)
Alkaline Phosphatase: 63 (ref 25–125)
Bilirubin, Total: 0.4

## 2022-03-09 NOTE — Telephone Encounter (Signed)
Printed labs and placed in Dr. G's box.  °

## 2022-03-09 NOTE — Telephone Encounter (Signed)
Received PA form from [facility].  Placed form in Dr. Synthia Innocent box.  ?

## 2022-03-10 ENCOUNTER — Encounter: Payer: Self-pay | Admitting: Family Medicine

## 2022-03-10 NOTE — Telephone Encounter (Signed)
Pt returned Lisa's call, I advised her of Dr. Timoteo Expose comments regarding PA. Pt said she would rather start med now instead of referral to testing. Pt's only concern is she has been dealing with some abnormal liver labs and wants to make sure that the Wellbutrin wouldn't affect her liver. ? ?[facility]  ?

## 2022-03-10 NOTE — Telephone Encounter (Addendum)
Faxed PA form.  Decision pending.  ? ?Lvm asking pt to call back.  Need to relay Dr. Timoteo Expose message and see if pt agrees to try Wellbutrin or prefers referral.  ?

## 2022-03-10 NOTE — Telephone Encounter (Signed)
Filled and in Lisa's box. ?Plz notify pt - her insurance will likely decline Strattera without first completing formal psychological testing for ADHD.  ?Alternatively we could try wellbutrin which is a mood medicine that can also help with attention. Vs referral to psychology for formal testing.  ?

## 2022-03-13 NOTE — Telephone Encounter (Addendum)
Received PA denial.  Reason:  Current plan approved criteria does not allow coverage of atomoxetine if the dx has not been appropriately dcoumented.   ?

## 2022-03-13 NOTE — Telephone Encounter (Signed)
Left message on voicemail to call the office back. 

## 2022-03-13 NOTE — Telephone Encounter (Addendum)
Wellbutrin is unfortunately metabolized through the liver.  ?I think still ok to try it, but would recommend retesting liver function 2-3 wks after starting.  ?Is she ok with trying it?  ?

## 2022-03-14 NOTE — Telephone Encounter (Signed)
[facility] Night - Client ?Nonclinical Telephone Record  ?AccessNurse? ?Client [facility] Night - Client ?Client Site [facility] - Night ?Provider Eustaquio Boyden - MD ?Contact Type Call ?Who Is Calling Patient / Member / Family / Caregiver ?Caller Name Marcelle Hepner ?Caller Phone Number [phone] ?Call Type Message Only Information Provided ?Reason for Call Returning a Call from the Office ?Initial Comment Caller is returning a call from one of Dr. Sharen Hones nurses. ?Disp. Time Disposition Final User ?03/13/2022 5:16:15 PM General Information Provided Yes Arita Miss ?Call Closed By: Arita Miss ?Transaction Date/Time: 03/13/2022 5:14:28 PM (ET ?

## 2022-03-14 NOTE — Telephone Encounter (Signed)
Lvm asking pt to call back.  Need to relay Dr. G's message.  

## 2022-03-16 NOTE — Telephone Encounter (Addendum)
Lvm asking pt to call back.  Need to relay Dr. Timoteo Expose message.  Sending MyChart message. ?

## 2022-03-20 ENCOUNTER — Ambulatory Visit: Payer: BC Managed Care – PPO

## 2022-03-20 DIAGNOSIS — Q6671 Congenital pes cavus, right foot: Secondary | ICD-10-CM

## 2022-03-20 DIAGNOSIS — M722 Plantar fascial fibromatosis: Secondary | ICD-10-CM

## 2022-03-20 NOTE — Progress Notes (Signed)
SITUATION: ?Reason for Visit: Fitting and Delivery of Custom Fabricated Foot Orthoses ?Patient Report: Patient reports comfort and is satisfied with device. ? ?OBJECTIVE DATA: ?Patient History / Diagnosis:   ?  ICD-10-CM   ?1. Pes cavus of both feet  Q66.71   ? Q66.72   ?  ?2. Plantar fasciitis  M72.2   ?  ? ? ?Provided Device:  Custom Functional Foot Orthotics ?    RicheyLAB: [id number] ? ?GOAL OF ORTHOSIS ?- Improve gait ?- Decrease energy expenditure ?- Improve Balance ?- Provide Triplanar stability of foot complex ?- Facilitate motion ? ?ACTIONS PERFORMED ?Patient was fit with foot orthotics trimmed to shoe last. Patient tolerated fittign procedure.  ? ?Patient was provided with verbal and written instruction and demonstration regarding donning, doffing, wear, care, proper fit, function, purpose, cleaning, and use of the orthosis and in all related precautions and risks and benefits regarding the orthosis. ? ?Patient was also provided with verbal instruction regarding how to report any failures or malfunctions of the orthosis and necessary follow up care. Patient was also instructed to contact our office regarding any change in status that may affect the function of the orthosis. ? ?Patient demonstrated independence with proper donning, doffing, and fit and verbalized understanding of all instructions. ? ?PLAN: ?Patient is to follow up in one week or as necessary (PRN). All questions were answered and concerns addressed. Plan of care was discussed with and agreed upon by the patient. ? ?

## 2022-03-24 ENCOUNTER — Telehealth: Payer: Self-pay

## 2022-04-06 ENCOUNTER — Ambulatory Visit
Admission: RE | Admit: 2022-04-06 | Discharge: 2022-04-06 | Disposition: A | Discharge status: Home / Self Care | Payer: BC Managed Care – PPO | Source: Ambulatory Visit | Attending: Family Medicine | Admitting: Family Medicine

## 2022-04-06 DIAGNOSIS — K769 Liver disease, unspecified: Secondary | ICD-10-CM

## 2022-04-06 DIAGNOSIS — K760 Fatty (change of) liver, not elsewhere classified: Secondary | ICD-10-CM

## 2022-04-06 MED ORDER — GADOBENATE DIMEGLUMINE 529 MG/ML IV SOLN
20.0000 mL | Freq: Once | INTRAVENOUS | Status: AC | PRN
  Administered 2022-04-06: 20 mL via INTRAVENOUS

## 2022-10-02 ENCOUNTER — Telehealth: Payer: Self-pay | Admitting: Family Medicine

## 2022-10-02 NOTE — Telephone Encounter (Signed)
Pt rtn call.  I informed pt she will need CPE to have form completed.  States she now lives in [place] and will get CPE at a Walk-in or UC until she establishes with new PCP.  She asks that form be shredded.  Fyi to Dr. Darnell Level.

## 2022-10-02 NOTE — Telephone Encounter (Signed)
Lvm asking pt to call back.  Needs CPE (overdue- last was 06/17/19)  and lab visits to have form completed.  [Placed form in basket on Lisa's desk.]

## 2022-10-02 NOTE — Telephone Encounter (Signed)
Patient emailed over a physical that needs to be filled out for work. Placed in Dr. Darnell Level box.

## 2022-10-02 NOTE — Telephone Encounter (Signed)
Noted. Thanks.
